# Patient Record
Sex: Male | Born: 1945 | Race: White | Hispanic: No | Marital: Married | Smoking: Never smoker
Health system: Southern US, Community
[De-identification: ages and names within clinical notes are randomized; demographics above are authoritative.]

## PROBLEM LIST (undated history)

## (undated) DIAGNOSIS — F419 Anxiety disorder, unspecified: Secondary | ICD-10-CM

## (undated) DIAGNOSIS — H269 Unspecified cataract: Secondary | ICD-10-CM

## (undated) DIAGNOSIS — E669 Obesity, unspecified: Secondary | ICD-10-CM

## (undated) DIAGNOSIS — T7840XA Allergy, unspecified, initial encounter: Secondary | ICD-10-CM

## (undated) DIAGNOSIS — E559 Vitamin D deficiency, unspecified: Secondary | ICD-10-CM

## (undated) DIAGNOSIS — E119 Type 2 diabetes mellitus without complications: Secondary | ICD-10-CM

## (undated) DIAGNOSIS — N200 Calculus of kidney: Secondary | ICD-10-CM

## (undated) DIAGNOSIS — E785 Hyperlipidemia, unspecified: Secondary | ICD-10-CM

## (undated) DIAGNOSIS — K219 Gastro-esophageal reflux disease without esophagitis: Secondary | ICD-10-CM

## (undated) DIAGNOSIS — I10 Essential (primary) hypertension: Secondary | ICD-10-CM

## (undated) DIAGNOSIS — E1340 Other specified diabetes mellitus with diabetic neuropathy, unspecified: Secondary | ICD-10-CM

## (undated) DIAGNOSIS — M1990 Unspecified osteoarthritis, unspecified site: Secondary | ICD-10-CM

## (undated) HISTORY — PX: OTHER SURGICAL HISTORY: SHX169

## (undated) HISTORY — PX: ELBOW SURGERY: SHX618

## (undated) HISTORY — DX: Essential (primary) hypertension: I10

## (undated) HISTORY — DX: Type 2 diabetes mellitus without complications: E11.9

## (undated) HISTORY — DX: Gastro-esophageal reflux disease without esophagitis: K21.9

## (undated) HISTORY — DX: Unspecified osteoarthritis, unspecified site: M19.90

## (undated) HISTORY — DX: Allergy, unspecified, initial encounter: T78.40XA

## (undated) HISTORY — PX: CATARACT EXTRACTION PHACO AND INTRAOCULAR LENS PLACEMENT W/ CORTICOSTEROID: SHX7020

## (undated) HISTORY — DX: Obesity, unspecified: E66.9

## (undated) HISTORY — DX: Unspecified cataract: H26.9

## (undated) HISTORY — PX: BACK SURGERY: SHX140

## (undated) HISTORY — DX: Other specified diabetes mellitus with diabetic neuropathy, unspecified: E13.40

## (undated) HISTORY — DX: Hyperlipidemia, unspecified: E78.5

## (undated) HISTORY — DX: Anxiety disorder, unspecified: F41.9

## (undated) HISTORY — DX: Vitamin D deficiency, unspecified: E55.9

## (undated) HISTORY — DX: Calculus of kidney: N20.0

---

## 1998-01-18 ENCOUNTER — Ambulatory Visit (HOSPITAL_COMMUNITY): Admission: RE | Admit: 1998-01-18 | Discharge: 1998-01-18 | Payer: Self-pay | Admitting: *Deleted

## 2000-04-12 ENCOUNTER — Ambulatory Visit (HOSPITAL_COMMUNITY): Admission: RE | Admit: 2000-04-12 | Discharge: 2000-04-12 | Payer: Self-pay | Admitting: *Deleted

## 2000-11-17 ENCOUNTER — Ambulatory Visit (HOSPITAL_COMMUNITY): Admission: RE | Admit: 2000-11-17 | Discharge: 2000-11-17 | Payer: Self-pay | Admitting: Internal Medicine

## 2000-11-17 ENCOUNTER — Encounter: Payer: Self-pay | Admitting: Internal Medicine

## 2002-02-02 ENCOUNTER — Encounter: Payer: Self-pay | Admitting: Gastroenterology

## 2002-02-08 ENCOUNTER — Encounter: Payer: Self-pay | Admitting: Gastroenterology

## 2002-05-05 ENCOUNTER — Emergency Department (HOSPITAL_COMMUNITY): Admission: EM | Admit: 2002-05-05 | Discharge: 2002-05-05 | Payer: Self-pay | Admitting: Emergency Medicine

## 2002-05-05 ENCOUNTER — Encounter: Payer: Self-pay | Admitting: Emergency Medicine

## 2004-07-25 ENCOUNTER — Ambulatory Visit (HOSPITAL_COMMUNITY): Admission: RE | Admit: 2004-07-25 | Discharge: 2004-07-25 | Payer: Self-pay | Admitting: Internal Medicine

## 2004-07-30 ENCOUNTER — Ambulatory Visit (HOSPITAL_COMMUNITY): Admission: RE | Admit: 2004-07-30 | Discharge: 2004-07-30 | Payer: Self-pay | Admitting: Internal Medicine

## 2004-08-05 ENCOUNTER — Ambulatory Visit (HOSPITAL_COMMUNITY): Admission: RE | Admit: 2004-08-05 | Discharge: 2004-08-05 | Payer: Self-pay | Admitting: Internal Medicine

## 2004-11-04 ENCOUNTER — Ambulatory Visit (HOSPITAL_COMMUNITY): Admission: RE | Admit: 2004-11-04 | Discharge: 2004-11-04 | Payer: Self-pay | Admitting: Orthopaedic Surgery

## 2004-11-04 ENCOUNTER — Encounter (INDEPENDENT_AMBULATORY_CARE_PROVIDER_SITE_OTHER): Payer: Self-pay | Admitting: *Deleted

## 2004-11-04 ENCOUNTER — Ambulatory Visit (HOSPITAL_BASED_OUTPATIENT_CLINIC_OR_DEPARTMENT_OTHER): Admission: RE | Admit: 2004-11-04 | Discharge: 2004-11-04 | Payer: Self-pay | Admitting: Orthopaedic Surgery

## 2005-04-16 ENCOUNTER — Ambulatory Visit: Payer: Self-pay

## 2005-08-04 ENCOUNTER — Ambulatory Visit (HOSPITAL_COMMUNITY): Admission: RE | Admit: 2005-08-04 | Discharge: 2005-08-04 | Payer: Self-pay | Admitting: Internal Medicine

## 2005-08-17 ENCOUNTER — Encounter: Admission: RE | Admit: 2005-08-17 | Discharge: 2005-11-15 | Payer: Self-pay | Admitting: Internal Medicine

## 2005-09-16 ENCOUNTER — Ambulatory Visit (HOSPITAL_COMMUNITY): Admission: RE | Admit: 2005-09-16 | Discharge: 2005-09-16 | Payer: Self-pay | Admitting: Internal Medicine

## 2009-06-04 ENCOUNTER — Ambulatory Visit (HOSPITAL_BASED_OUTPATIENT_CLINIC_OR_DEPARTMENT_OTHER): Admission: RE | Admit: 2009-06-04 | Discharge: 2009-06-04 | Payer: Self-pay | Admitting: Orthopaedic Surgery

## 2010-01-14 ENCOUNTER — Encounter: Payer: Self-pay | Admitting: Gastroenterology

## 2010-02-20 ENCOUNTER — Ambulatory Visit: Payer: Self-pay | Admitting: Gastroenterology

## 2010-02-20 DIAGNOSIS — K219 Gastro-esophageal reflux disease without esophagitis: Secondary | ICD-10-CM

## 2010-08-14 NOTE — Procedures (Signed)
Summary: EGD   EGD  Procedure date:  02/08/2002  Findings:      Findings: Stricture: 530.3 530.2:Barretts Location: [facility]    EGD  Procedure date:  02/08/2002  Findings:      Findings: Stricture: 530.3 530.2:Barretts Location: [facility]   Patient Name: Timothy Boyer, Timothy Boyer MRN:  Procedure Procedures: Panendoscopy (EGD) CPT: 43235.    with biopsy(s)/brushing(s). CPT: D1846139.    with esophageal dilation. CPT: G9296129.  Personnel: Endoscopist: Barbette Hair. Arlyce Dice, MD.  Referred By: Marisue Brooklyn, D.O.  Indications Symptoms: Dysphagia.  History  Pre-Exam Physical: Performed Feb 08, 2002  Entire physical exam was normal.  Exam Exam Info: Maximum depth of insertion Duodenum, intended Duodenum. Vocal cords visualized. Gastric retroflexion performed. ASA Classification: II. Tolerance: fair, adequate exam.  Sedation Meds: Robinul 0.2 given IV. Fentanyl 50 mcg. given IV. Versed 5 mg. given IV. Cetacaine Spray 2 sprays given aerosolized.  Monitoring: BP and pulse monitoring done. Oximetry used. Supplemental O2 given at 2 Liters.  Findings STRICTURE / STENOSIS: Stricture in Distal Esophagus.  38 cm from mouth. ICD9: Esophageal Stricture: 530.3.  - Dilation: Distal Esophagus. Maloney dilator used, Diameter: 17 mm, No Resistance, No Heme present on extraction. Outcome: successful.  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 Proximal margin 37 cm from mouth,  distal margin 40 cm. Length of Barrett's 3 cm. No inflammation present. Biopsy/Barrett's taken. ICD9: Barrett's: 530.2.   Assessment Abnormal examination, see findings above.  Diagnoses: 530.2: Barrett's.  530.3: Esophageal Stricture.   Events  Unplanned Intervention: No unplanned interventions were required.  Unplanned Events: There were no complications. Plans Medication(s): Await pathology.  Scheduling: Office Visit, to [employer]. Arlyce Dice, MD, around Mar 22, 2002.    This report was  created from the original endoscopy report, which was reviewed and signed by the above listed endoscopist.    cc. Amy Stevenson,DO

## 2010-08-14 NOTE — Procedures (Signed)
Summary: colonoscopy   Colonoscopy  Procedure date:  02/02/2002  Findings:      Results: Polyp.    Comments:      Repeat colonoscopy in 5 years.  Patient Name: Timothy Boyer, Timothy Boyer MRN:  Procedure Procedures: Colonoscopy CPT: 16109.    with Hot Biopsy(s)CPT: Z451292.  Personnel: Endoscopist: Barbette Hair. Arlyce Dice, MD.  Referred By: Marisue Brooklyn, D.O.  Indications  Average Risk Screening Routine.  History  Pre-Exam Physical: Performed Feb 02, 2002. Entire physical exam was normal.  Exam Exam: Extent of exam reached: Cecum, extent intended: Cecum.  The cecum was identified by IC valve. Colon retroflexion performed. ASA Classification: II. Tolerance: good.  Monitoring: Pulse and BP monitoring, Oximetry used. Supplemental O2 given. at 2 Liters.  Colon Prep Used Golytely for colon prep. Prep results: good.  Sedation Meds: Fentanyl 100 mcg. given IV. Versed 10 mg. given IV.  Findings POLYP: Ascending Colon, Maximum size: 3 mm. Procedure:  hot biopsy, ICD9: Colon Polyps: 211.3.   Assessment Abnormal examination, see findings above.  Diagnoses: 211.3: Colon Polyps.   Events  Unplanned Interventions: No intervention was required.  Unplanned Events: There were no complications. Plans Patient Education: Patient given standard instructions for: Polyps.  Scheduling/Referral: Colonoscopy, to Barbette Hair. Arlyce Dice, MD, around Feb 03, 2007.    This report was created from the original endoscopy report, which was reviewed and signed by the above listed endoscopist.    cc. Amy Stevenson,DO

## 2010-08-14 NOTE — Assessment & Plan Note (Signed)
Summary: barretts esophagus//family hx of esophageal cancer--ch.   History of Present Illness Visit Type: consult  Primary GI MD: Melvia Heaps MD [facility] Primary Lynne Takemoto: Lovenia Kim, DO  Requesting Darah Simkin: Lovenia Kim, DO  Chief Complaint: IBS and hemorrhoids  History of Present Illness:   Mr. Timothy Boyer is a pleasant 65 year old white male referred at the request of Dr. Elisabeth Most for evaluation of reflux.  In 2003 he underwent endoscopy and dilatation of a distal esophageal stricture.  Biopsies were taken because of a suspicion of Barrett's esophagus but this was not demonstrated by pathology.  A non-adenomatous polyp was removed at screening colonoscopy.  He has no GI complaints including pyrosis, dysphagia or change in bowel habits.  Family history is pertinent for a  brother who has either laryngeal or esophageal cancer   GI Review of Systems      Denies abdominal pain, acid reflux, belching, bloating, chest pain, dysphagia with liquids, dysphagia with solids, heartburn, loss of appetite, nausea, vomiting, vomiting blood, weight loss, and  weight gain.      Reports hemorrhoids and  irritable bowel syndrome.     Denies anal fissure, black tarry stools, change in bowel habit, constipation, diarrhea, diverticulosis, fecal incontinence, heme positive stool, jaundice, light color stool, liver problems, rectal bleeding, and  rectal pain.    Current Medications (verified): 1)  Claritin 10 Mg Tabs (Loratadine) .Marland Kitchen.. 1 By Mouth Once Daily 2)  Proventil Hfa 108 (90 Base) Mcg/act Aers (Albuterol Sulfate) .... Use Daily 3)  Omeprazole 20 Mg Cpdr (Omeprazole) .... One Tablet By Mouth Once Daily 4)  Aspirin 81 Mg Tbec (Aspirin) .Marland Kitchen.. 1 By Mouth Once Daily 5)  Gabapentin 300 Mg Caps (Gabapentin) .... One Tablet By Mouth Two Times A Day 6)  Zetia 10 Mg Tabs (Ezetimibe) .... One Tablet By Mouth Once Daily 7)  Triamterene-Hctz 37.5-25 Mg Tabs (Triamterene-Hctz) .... One Tablet By Mouth Once  Daily 8)  Finasteride 5 Mg Tabs (Finasteride) .... One Tablet By Mouth Once Daily 9)  Amlodipine Besy-Benazepril Hcl 10-20 Mg Caps (Amlodipine Besy-Benazepril Hcl) .... One Tablet By Mouth Once Daily 10)  Metformin Hcl 500 Mg Tabs (Metformin Hcl) .... Two Tablets By Mouth in The Morning and Two Tablets By Mouth At Night 11)  Dialyvite Vitamin D 5000 5000 Unit Caps (Cholecalciferol) .... One Capsule By Mouth Two Times A Day 12)  Citalopram Hydrobromide 40 Mg Tabs (Citalopram Hydrobromide) .... One Tablet By Mouth Once Daily 13)  Hydrocodone-Acetaminophen 7.5-750 Mg Tabs (Hydrocodone-Acetaminophen) .... As Needed 14)  Advair Diskus 100-50 Mcg/dose Aepb (Fluticasone-Salmeterol) .... As Directed 15)  Sm Folic Acid 400 Mcg Tabs (Folic Acid) .... One Tablet By Mouth Once Daily  Allergies (verified): 1)  ! Penicillin  Past History:  Past Medical History: Asthma Barretts Esophagus Diabetes GERD Hyperlipidemia Hypertension Kidney Stones Depression Fibromyalgia Irritable Bowel Syndrome  Past Surgical History: Vasectomy Bilateral Hand/ Finger Surgery Bilateral Wrist Surgery Left Elbow Surgery   Family History: No FH of Colon Cancer: Family History of Prostate Cancer:MGF Family History of Diabetes: 2 Sisters  Social History: Occupation: Curator Married Childern Patient has never smoked.  Alcohol Use - no Illicit Drug Use - no Smoking Status:  never Drug Use:  no  Review of Systems       The patient complains of allergy/sinus, back pain, change in vision, depression-new, itching, muscle pains/cramps, skin rash, swelling of feet/legs, and urine leakage.  The patient denies anemia, anxiety-new, arthritis/joint pain, blood in urine, breast changes/lumps, confusion, cough, coughing up  blood, fainting, fatigue, fever, headaches-new, hearing problems, heart murmur, heart rhythm changes, night sweats, nosebleeds, shortness of breath, sleeping problems, sore throat, swollen lymph  glands, thirst - excessive, urination - excessive, urination changes/pain, vision changes, and voice change.         All other systems were reviewed and were negative   Vital Signs:  Patient profile:   65 year old male Height:      67 inches Weight:      224 pounds BMI:     35.21 BSA:     2.12 Pulse rate:   96 / minute Pulse rhythm:   regular BP sitting:   142 / 86  (right arm) Cuff size:   regular  Vitals Entered By: Ok Anis CMA (February 20, 2010 3:26 PM)  Physical Exam  Additional Exam:  On physical exam he is a well-developed well-nourished male  skin: anicteric HEENT: normocephalic; PEERLA; no nasal or pharyngeal abnormalities neck: supple nodes: no cervical lymphadenopathy chest: clear to ausculatation and percussion heart: no murmurs, gallops, or rubs abd: soft, nontender; BS normoactive; no abdominal masses, tenderness, organomegaly rectal: deferred ext: no cynanosis, clubbing, edema skeletal: no deformities neuro: oriented x 3; no focal abnormalities    Impression & Recommendations:  Problem # 1:  GERD (ICD-530.81) Symptoms are well controlled with omeprazole.  There is no evidence for Barrett's esophagus.  Recommendations #1 continue omeprazole #2 I see no reason to repeat his endoscopy since she is at no higher  risk for developing carcinoma of the esophagus  Problem # 2:  SPECIAL SCREENING FOR MALIGNANT NEOPLASMS COLON (ICD-V76.51) Plan followup colonoscopy in 2013  Patient Instructions: 1)  Please continue current medications.  2)  Please schedule a follow-up appointment as needed.  3)  Copy sent to : Marisue Brooklyn, DO 4)  The medication list was reviewed and reconciled.  All changed / newly prescribed medications were explained.  A complete medication list was provided to the patient / caregiver.

## 2010-10-15 LAB — BASIC METABOLIC PANEL
BUN: 19 mg/dL (ref 6–23)
Creatinine, Ser: 0.88 mg/dL (ref 0.4–1.5)
GFR calc non Af Amer: >60 mL/min (ref >60)
Potassium: 3.8 mEq/L (ref 3.5–5.1)

## 2010-10-15 LAB — GLUCOSE, CAPILLARY: Glucose-Capillary: 108 mg/dL — ABNORMAL HIGH (ref 70–99)

## 2010-11-28 NOTE — Op Note (Signed)
Timothy Boyer, Timothy Boyer                ACCOUNT NO.:  [id number]   MEDICAL RECORD NO.:  [id number]          PATIENT TYPE:  AMB   LOCATION:  [facility]                          FACILITY:  [facility]   PHYSICIAN:  Lubertha Basque. Dalldorf, M.D.DATE OF BIRTH:  05/27/1946   DATE OF PROCEDURE:  11/04/2004  DATE OF DISCHARGE:                                 OPERATIVE REPORT   PREOPERATIVE DIAGNOSES:  1.  Right long trigger finger.  2.  Left thumb trigger thumb.  3.  Right ring cyst.  4.  Right foot mass.  5.  Five left heel plantar fasciitis.   POSTOPERATIVE DIAGNOSES:  1.  Right long trigger finger.  2.  Left thumb trigger thumb.  3.  Right ring cyst.  4.  Right foot mass.  5.  Five left heel plantar fasciitis.   PROCEDURES:  1.  Right long trigger finger.  2.  Left trigger thumb release.  3.  Right ring finger cyst excision.  4.  Right foot mass excision.  5.  Left heel injection.   ANESTHESIA:  General.   ATTENDING:  Lubertha Basque. Jerl Santos, M.D.   ASSISTANT:  Lindwood Qua, P.A.   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  He has had multiple injections  about his trigger fingers but persists with difficulty.  He is now offered  release primarily for his trigger finger issues but also has the cyst and  mass and fasciitis which he would like addressed of the same time.  Informed  operative consent was obtained after discussion of possible complications  of, reaction to anesthesia, infection, neurovascular injury, and recurrence  at all sites.   DESCRIPTION OF PROCEDURE:  The patient was taken to the operating suite,  where a general anesthetic was applied without difficulty.  He was then positioned supine and prepped and draped in normal sterile  fashion with three extremities appropriately draped.  After administration  of preop IV antibiotic, the left hand was addressed first.  His arm was  elevated, exsanguinated,  and a tourniquet inflated about the wrist.  A small  incision was made at the MCP flexion crease of the thumb with dissection  down to the A1 pulley, which was released.  This was very thickened.  I then  passively moved his thumb and no triggering was noted after release of the  pulley.  The wound was irrigated, followed by release of the tourniquet.  The fingers and thumb became pink and warm immediately.  The skin was  reapproximated with nylon, followed by injection with some Marcaine.  We  then addressed the right arm.  This arm was elevated, exsanguinated, and a  tourniquet inflated about the forearm.  An incision at the base of the long  finger just below the distal palmar flexion crease was made.  Dissection was  carried down to a very thickened A1 pulley, which was released.  A second  incision was made longitudinally at the base of the ring finger on its  radial border.  Dissection was carried down  with care taken to retract  neurovascular structures out of harm's way.  A fairly firm cyst measuring  about 5 x 3 x 2 mm was excised.  This was relatively adherent to the flexor  tendon sheath.  This was sent to pathology.  The wound was irrigated.  The  tourniquet was deflated, and a small amount bleeding was easily controlled  with Bovie cautery, but all fingers became pink and warm immediately.  The  two wounds were reapproximated with nylon, followed by injection with some  Marcaine.  We then addressed the right foot.  This was elevated,  exsanguinated, and a tourniquet inflated about his calf.  A small lateral  incision was made near the fifth metatarsal head.  Dissection was carried in the subcutaneous direction to the cyst, which was  located subcutaneously just proximal to the fifth metatarsal head on the  plantar aspect of the foot.  This was excised with a rongeur and seemed to  be fibrous tissue.  The tourniquet was deflated.  A small amount bleeding  was easily controlled  with Bovie cautery and with compression.  His toes all  became pink and warm immediately.  This wound was irrigated followed by closure with nylon and placement of  some Marcaine around the incision.  All of the aforementioned sites were  dressed with Adaptic and dry gauze dressing with some loose Ace wraps.  We  then performed a sterile prep on the lateral aspect of the left heel and  injected this with Depo-Medrol and Marcaine.  A total of 80 mg of Depo were  placed.  A Band-Aid was applied here.   Estimated blood loss and fluids can be obtained from anesthesia records, as  can all accurate tourniquet times.   DISPOSITION:  The patient was extubated in the operating room and taken to  the recovery room in stable condition.  Plans were for him to go home the same day and follow up in the office in  less than a week.  I will contact by phone tonight.  He had been mentioned  for this position. Estimated blood loss and fluids and pain for anesthesia  records as can all accurate tourniquet time.      PGD/MEDQ  D:  11/04/2004  T:  11/04/2004  Job:  [id number]

## 2011-01-13 ENCOUNTER — Encounter (HOSPITAL_BASED_OUTPATIENT_CLINIC_OR_DEPARTMENT_OTHER)
Admission: RE | Admit: 2011-01-13 | Discharge: 2011-01-13 | Disposition: A | Discharge status: Home / Self Care | Payer: BC Managed Care – PPO | Source: Ambulatory Visit | Attending: Orthopedic Surgery | Admitting: Orthopedic Surgery

## 2011-01-13 LAB — BASIC METABOLIC PANEL
BUN: 20 mg/dL (ref 6–23)
Calcium: 9.3 mg/dL (ref 8.4–10.5)
GFR calc non Af Amer: >60 mL/min (ref >60)
Glucose, Bld: 145 mg/dL — ABNORMAL HIGH (ref 70–99)

## 2011-01-15 ENCOUNTER — Other Ambulatory Visit: Payer: Self-pay | Admitting: Orthopedic Surgery

## 2011-01-15 ENCOUNTER — Ambulatory Visit (HOSPITAL_BASED_OUTPATIENT_CLINIC_OR_DEPARTMENT_OTHER)
Admission: RE | Admit: 2011-01-15 | Discharge: 2011-01-15 | Disposition: A | Discharge status: Home / Self Care | Payer: BC Managed Care – PPO | Source: Ambulatory Visit | Attending: Orthopedic Surgery | Admitting: Orthopedic Surgery

## 2011-01-15 DIAGNOSIS — J45909 Unspecified asthma, uncomplicated: Secondary | ICD-10-CM | POA: Insufficient documentation

## 2011-01-15 DIAGNOSIS — Z01812 Encounter for preprocedural laboratory examination: Secondary | ICD-10-CM | POA: Insufficient documentation

## 2011-01-15 DIAGNOSIS — E119 Type 2 diabetes mellitus without complications: Secondary | ICD-10-CM | POA: Insufficient documentation

## 2011-01-15 DIAGNOSIS — G609 Hereditary and idiopathic neuropathy, unspecified: Secondary | ICD-10-CM | POA: Insufficient documentation

## 2011-01-15 DIAGNOSIS — M65839 Other synovitis and tenosynovitis, unspecified forearm: Secondary | ICD-10-CM | POA: Insufficient documentation

## 2011-01-15 DIAGNOSIS — K219 Gastro-esophageal reflux disease without esophagitis: Secondary | ICD-10-CM | POA: Insufficient documentation

## 2011-01-15 DIAGNOSIS — Z01810 Encounter for preprocedural cardiovascular examination: Secondary | ICD-10-CM | POA: Insufficient documentation

## 2011-01-15 DIAGNOSIS — M65849 Other synovitis and tenosynovitis, unspecified hand: Secondary | ICD-10-CM | POA: Insufficient documentation

## 2011-01-15 DIAGNOSIS — M24549 Contracture, unspecified hand: Secondary | ICD-10-CM | POA: Insufficient documentation

## 2011-01-15 DIAGNOSIS — M720 Palmar fascial fibromatosis [Dupuytren]: Secondary | ICD-10-CM | POA: Insufficient documentation

## 2011-01-16 NOTE — Op Note (Signed)
NAMEZEBULIN, SIEGEL NO.:  [id number]  MEDICAL RECORD NO.:  [id number]  LOCATION:                                 FACILITY:  PHYSICIAN:  Katy Fitch. Arlette Schaad, M.D.      DATE OF BIRTH:  DATE OF PROCEDURE:  01/15/2011 DATE OF DISCHARGE:                              OPERATIVE REPORT   PREOPERATIVE DIAGNOSES: 1. Dupuytren's palmar fibromatosis, right palm and pretendinous fibers     to ring finger and long finger. 2. 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 3. 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  POSTOPERATIVE DIAGNOSIS:  Complex tendinopathy involving the flexor digitorum superficialis tendon of right long finger with large nodular tendinopathy mass that was resected for biopsy and crystal examination with a specimen of tendon being placed in absolute alcohol.  OPERATIONS: 1. Resection of Dupuytren's palmar fibromatosis from right ring finger     pretendinous fibers. 2. Resection of flexor digitorum superficialis from right long finger     with tenosynovectomy and tendon biopsy for routine H and E     evaluation and specimen of flexor digitorum superficialis tendon     sent in absolute alcohol for crystal examination. 3. Release of right ring finger A1 pulley.  OPERATING SURGEON:  Katy Fitch. Artasia Thang, MD  ASSISTANT:  Marveen Reeks Dasnoit, PA-C  ANESTHESIA:  General by LMA.  SUPERVISING ANESTHESIOLOGIST:  Janetta Hora. 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 Marcene Corning for evaluation and management of progressive flexion contracture of his right long finger following a prior trigger finger release and Dupuytren's palmar fibromatosis.  Mr. Motton  was becoming disabled due to a 90-degree flexion contracture of his right long finger PIP joint, mild flexion contracture of the DIP joint, and Dupuytren's palmar fibromatosis.  He is unable to place his hand into tight places while working on vehicles.  Preoperatively, we had a detailed informed consent.  He had a complex presentation with elements of Dupuytren's palmar fibromatosis causing flexion contracture of his ring finger and possibly the long finger, nodular tenosynovitis and/or tendinopathy of his long finger, and failure to respond to a prior long finger trigger release.  We advised him that we would proceed with exploration of the long and ring fingers anticipating fasciectomy and release of the right ring finger A1 pulley and we would address whatever pathology we identified in the long finger.  The spectrum of choices preoperatively included extensive nodular tenosynovitis versus tendinopathy leading to triggering beneath the A3 and/or A2 pulleys of long finger.  We discussed possible resection of superficialis slip versus subtotaled tendon resection to relieve nodular tenosynovitis.  Questions were invited and answered in detail.  PROCEDURE:  Bary Limbach was brought to room #2 at the [facility] and placed in supine position upon the operating table.  Following an anesthesia consult with Dr. Gelene Mink, general anesthesia by LMA technique  was recommended and accepted by Mr. Springer with a preoperative plexus block for postoperative comfort.  Under Dr. Thornton Dales strict supervision in room #2, general anesthesia by LMA technique was induced followed by routine Betadine scrub and paint of the right upper extremity.  Following routine surgical time-out and administration of 1 g of Ancef as an IV prophylactic antibiotic, the right arm was exsanguinated with Esmarch bandage and arterial tourniquet was inflated to 220 mmHg.  Procedure commenced with planning  Brunner zigzag incisions to allow exposure of the palmar fascia A1 pulley of both fingers and the flexor sheath between A3 and A1 of the long finger.  Skin incision was taken sharply followed by meticulous identification of the pretendinous fibers of the palmar fascia.  A formal Dupuytren's resection of the ring finger pretendinous fibers and fascia extending past the MP joint was completed followed by identification of the A1 pulley.  The A1 pulley was split with scalpel and scissors.  The tendon was delivered and cleared of pathologic tenosynovium.  The ring finger thereafter had full extension of the MP, PIP, and DIP joints and normal tendon glide.  Attention was then directed to the long finger where the extended Brunner incision was completed from the PIP flexion crease proximally to the midpalmar crease.  Extensive scarring was identified followed by release of multiple cords of Dupuytren's palmar fibromatosis.  The flexor sheath was very inflammatory.  There were areas of inflammatory tissue that were resected and passed off for pathologic evaluation.  The superficialis tendon was extremely swollen locking beneath the A2 pulley and causing compression of the profundus limiting DIP flexion and causing a DIP flexion posture.  We elected to proceed with resection of the ulnar slip of superficialis. This was complex as there was extensive scarring within the flexor sheath.  There was a chronic superficialis tendinopathy with firm nodules that could be either crystal or necrotic tendon.  A window was created between C1 and A3 distally and the ulnar slip of the superficialis insertion was released from the middle phalanx.  The decussation was identified by traction and released.  At the proximal wound with the PIP and DIP joints in flexion, we retracted the tendons and removed the ulnar slip of the superficialis segmentally.  The superficialis was then inspected and found to have  areas of nodular tendinopathy.  These were resected with scissors and placed in absolute alcohol for pathologic evaluation.  After completion of the debridement of superficialis tendon, we had normal glide of the profundus, full extension of the finger at the MP, PIP, and DIP joints, and full passive flexion.  After hemostasis was achieved, residual synovectomy was accomplished followed by repair of the skin wounds with corner sutures of 5-0 nylon and vertical mattress sutures of 5-0 nylon.  There were no apparent complications.  For aftercare, Mr. Yan is provided prescriptions for Percocet 5 mg 1 p.o. q.4-6 h. p.r.n. pain, 30 tablets without refill and also doxycycline 100 mg 1 p.o. b.i.d. x4 days as prophylactic antibiotic.     Katy Fitch Briggitte Boline, M.D.     RVS/MEDQ  D:  01/15/2011  T:  01/16/2011  Job:  [id number]  cc:   Lubertha Basque. Jerl Santos, M.D. Lovenia Kim, D.O.  Electronically Signed by Josephine Igo M.D. on 01/16/2011 09:46:18 AM

## 2012-01-26 ENCOUNTER — Encounter: Payer: Self-pay | Admitting: Gastroenterology

## 2012-08-19 ENCOUNTER — Other Ambulatory Visit: Payer: Self-pay | Admitting: Physician Assistant

## 2012-08-19 ENCOUNTER — Other Ambulatory Visit: Payer: Self-pay | Admitting: Internal Medicine

## 2012-08-19 DIAGNOSIS — I10 Essential (primary) hypertension: Secondary | ICD-10-CM

## 2012-08-23 ENCOUNTER — Encounter: Payer: Self-pay | Admitting: *Deleted

## 2012-08-25 ENCOUNTER — Other Ambulatory Visit: Payer: BC Managed Care – PPO

## 2012-08-30 ENCOUNTER — Ambulatory Visit (INDEPENDENT_AMBULATORY_CARE_PROVIDER_SITE_OTHER): Payer: Medicare Other | Admitting: Cardiology

## 2012-08-30 ENCOUNTER — Encounter: Payer: Self-pay | Admitting: Cardiology

## 2012-08-30 ENCOUNTER — Encounter: Payer: Self-pay | Admitting: *Deleted

## 2012-08-30 VITALS — BP 100/60 | HR 57 | Ht 68.0 in | Wt 215.0 lb

## 2012-08-30 DIAGNOSIS — I10 Essential (primary) hypertension: Secondary | ICD-10-CM

## 2012-08-30 DIAGNOSIS — R0609 Other forms of dyspnea: Secondary | ICD-10-CM

## 2012-08-30 DIAGNOSIS — R0600 Dyspnea, unspecified: Secondary | ICD-10-CM

## 2012-08-30 DIAGNOSIS — R0989 Other specified symptoms and signs involving the circulatory and respiratory systems: Secondary | ICD-10-CM

## 2012-08-30 DIAGNOSIS — E785 Hyperlipidemia, unspecified: Secondary | ICD-10-CM | POA: Insufficient documentation

## 2012-08-30 DIAGNOSIS — E669 Obesity, unspecified: Secondary | ICD-10-CM

## 2012-08-30 LAB — BASIC METABOLIC PANEL
BUN: 31 mg/dL — ABNORMAL HIGH (ref 6–23)
Chloride: 104 mEq/L (ref 96–112)
Creatinine, Ser: 1.1 mg/dL (ref 0.4–1.5)
GFR: 68.37 mL/min (ref >60.00)
Glucose, Bld: 105 mg/dL — ABNORMAL HIGH (ref 70–99)
Sodium: 142 mEq/L (ref 135–145)

## 2012-08-30 LAB — TSH: TSH: 1.5 u[IU]/mL (ref 0.35–5.50)

## 2012-08-30 NOTE — Patient Instructions (Addendum)
Your physician recommends that you schedule a follow-up appointment in: 8 WEEKS WITH DR Jens Som  Your physician has requested that you have a stress echocardiogram. For further information please visit [email or web address] Please follow instruction sheet as given.   Your physician recommends that you HAVE LAB WORK TODAY

## 2012-08-30 NOTE — Progress Notes (Signed)
HPI: 67 year old male for evaluation of hypertension. Patient apparently had a negative stress test in 2006. Patient has had hypertension since he was 68 years old. However in the past month his blood pressure has spiked with systolic as high as 200. His minoxidil was recently increased. Since then his blood pressure has averaged approximately 130 to 135/90-95 by his report. Patient has noted some dyspnea on exertion recently. No orthopnea, PND, pedal edema, syncope or exertional chest pain. The cause of his hypertension we were asked to evaluate.  Current Outpatient Prescriptions  Medication Sig Dispense Refill  . ALBUTEROL IN Inhale into the lungs as needed.      Marland Kitchen aspirin 81 MG tablet Take 81 mg by mouth daily.      . benazepril-hydrochlorthiazide (LOTENSIN HCT) 20-12.5 MG per tablet 2 tabs po qd      . BISOPROLOL FUMARATE PO 1 tab po qd 10/12.5mg       . citalopram (CELEXA) 40 MG tablet Take 40 mg by mouth daily.      . cloNIDine (CATAPRES) 0.2 MG tablet Take 0.2 mg by mouth.      . finasteride (PROSCAR) 5 MG tablet 1/2 tab po qd      . furosemide (LASIX) 40 MG tablet Take 40 mg by mouth daily.      Marland Kitchen gabapentin (NEURONTIN) 300 MG capsule 2 am 1 pm      . metFORMIN (GLUCOPHAGE) 1000 MG tablet Take 1,000 mg by mouth 2 (two) times daily with a meal.      . minoxidil (LONITEN) 10 MG tablet Take 10 mg by mouth daily.      . Naproxen Sodium (ALEVE PO) Take by mouth as needed.      Marland Kitchen omeprazole (PRILOSEC) 20 MG capsule Take 20 mg by mouth daily.      Marland Kitchen VITAMIN D, CHOLECALCIFEROL, PO Take 1 tablet by mouth daily.       No current facility-administered medications for this visit.    Allergies  Allergen Reactions  . Penicillins     Past Medical History  Diagnosis Date  . DM   . GERD   . HTN (hypertension)   . Obesity   . Hyperlipidemia   . Nephrolithiasis     Past Surgical History  Procedure Laterality Date  . Back surgery    . Carpel tunnal    . Elbow surgery      History    Social History  . Marital Status: Married    Spouse Name: N/A    Number of Children: 1  . 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 Smoking status: Never Smoker   . Smokeless tobacco: Not on file  . Alcohol Use: Yes     Comment: Rare  . Drug Use: No  . Sexually Active: Not on file   Other Topics Concern  . Not on file   Social History Narrative  . No narrative on file    Family History  Problem Relation Age of Onset  . Heart disease Brother     Valve surgery  . Heart disease Brother     ROS: no fevers or chills, productive cough, hemoptysis, dysphasia, odynophagia, melena, hematochezia, dysuria, hematuria, rash, seizure activity, orthopnea, PND, pedal edema, claudication. Remaining systems are negative.  Physical Exam:   Blood pressure 100/60, pulse 57, height 5\' 8"  (1.727 m), weight 215 lb (97.523 kg).  General:  Well developed/well  nourished in NAD Skin warm/dry Patient not depressed No peripheral clubbing Back-normal HEENT-normal/normal eyelids Neck supple/normal carotid upstroke bilaterally; no bruits; no JVD; no thyromegaly chest - CTA/ normal expansion CV - RRR/normal S1 and S2; no murmurs, rubs or gallops;  PMI nondisplaced Abdomen -NT/ND, no HSM, no mass, + bowel sounds, no bruit 2+ femoral pulses, no bruits Ext-no edema, chords, 2+ DP Neuro-grossly nonfocal  ECG 08/17/2012-sinus rhythm, cannot rule out prior septal infarct and nonspecific ST changes.  ECG shows sinus bradycardia at a rate of 57 and no ST changes.

## 2012-08-30 NOTE — Assessment & Plan Note (Signed)
Given diabetes mellitus will schedule stress echocardiogram to screen for coronary disease and to quantitate LV function.

## 2012-08-30 NOTE — Assessment & Plan Note (Signed)
Continue statin. 

## 2012-08-30 NOTE — Assessment & Plan Note (Signed)
Patient's blood pressure spiked recently by his report. He is scheduled for renal Dopplers tomorrow and we will await those results. Other causes of secondary hypertension are less likely. Check potassium, renal function and TSH. Check cortisol level.Timothy Boyer His blood pressure is controlled today. He will continue to track his blood pressure at home and keep records. He will bring his cuff for the next office visit. We will adjust his medical regimen based on followup readings.

## 2012-08-31 ENCOUNTER — Ambulatory Visit
Admission: RE | Admit: 2012-08-31 | Discharge: 2012-08-31 | Disposition: A | Discharge status: Home / Self Care | Payer: Medicare Other | Source: Ambulatory Visit | Attending: Physician Assistant | Admitting: Physician Assistant

## 2012-08-31 ENCOUNTER — Other Ambulatory Visit: Payer: Self-pay | Admitting: *Deleted

## 2012-08-31 DIAGNOSIS — I10 Essential (primary) hypertension: Secondary | ICD-10-CM

## 2012-09-05 ENCOUNTER — Encounter: Payer: Self-pay | Admitting: Cardiology

## 2012-09-05 ENCOUNTER — Telehealth: Payer: Self-pay | Admitting: Cardiology

## 2012-09-05 NOTE — Telephone Encounter (Signed)
Pt's wife calling to see if we received results  re ultrasound showing renal stenosis at [facility] imaging

## 2012-09-05 NOTE — Telephone Encounter (Signed)
Pt's wife called to see if MD has received the renal stenosis ultra sound if so does the U/S results affects the plans of having the stress echo done on 09/09/12. Pt and wife aware that we have the renal US results from [facility] in pt's records.

## 2012-09-06 NOTE — Telephone Encounter (Signed)
Left message for pt wife, pt will continue to have stress testing for dyspnea.

## 2012-09-09 ENCOUNTER — Ambulatory Visit (HOSPITAL_COMMUNITY): Payer: Medicare Other | Attending: Internal Medicine

## 2012-09-09 ENCOUNTER — Other Ambulatory Visit (INDEPENDENT_AMBULATORY_CARE_PROVIDER_SITE_OTHER): Payer: Medicare Other

## 2012-09-09 ENCOUNTER — Encounter: Payer: Self-pay | Admitting: Cardiology

## 2012-09-09 DIAGNOSIS — R0609 Other forms of dyspnea: Secondary | ICD-10-CM | POA: Insufficient documentation

## 2012-09-09 DIAGNOSIS — R0989 Other specified symptoms and signs involving the circulatory and respiratory systems: Secondary | ICD-10-CM | POA: Insufficient documentation

## 2012-09-09 DIAGNOSIS — R7989 Other specified abnormal findings of blood chemistry: Secondary | ICD-10-CM

## 2012-09-09 LAB — BASIC METABOLIC PANEL
Chloride: 105 mEq/L (ref 96–112)
Creatinine, Ser: 1.1 mg/dL (ref 0.4–1.5)
Potassium: 3.7 mEq/L (ref 3.5–5.1)

## 2012-09-09 NOTE — Progress Notes (Signed)
Echocardiogram performed.  

## 2012-09-12 ENCOUNTER — Telehealth: Payer: Self-pay | Admitting: Cardiology

## 2012-09-12 NOTE — Telephone Encounter (Signed)
New problem   Pt's wife called and stated Dr Tenny Craw called pt on Friday and told pt to take it easy during the weekend and was told someone from our office would call him today she don't know what for and was very concerned about this. Pt's wife was wondering if someone was going to call today and who. She haven't heard from anyone yet.

## 2012-09-12 NOTE — Telephone Encounter (Signed)
Pt needs appt with Dr. Jens Som sooner rather than later to follow up on echo.

## 2012-09-12 NOTE — Telephone Encounter (Signed)
Pt given an appt for 4/4 at 8:15 with Dr. Jens Som @ 8:15. Pt wants sooner appt as he and his wife are concerned about his Echo results. Please call pt back.

## 2012-09-13 NOTE — Telephone Encounter (Signed)
Spoke with pt wife, appointment rescheduled.

## 2012-09-15 ENCOUNTER — Encounter: Payer: Self-pay | Admitting: Cardiology

## 2012-09-15 ENCOUNTER — Ambulatory Visit (INDEPENDENT_AMBULATORY_CARE_PROVIDER_SITE_OTHER)
Admission: RE | Admit: 2012-09-15 | Discharge: 2012-09-15 | Disposition: A | Discharge status: Home / Self Care | Payer: Medicare Other | Source: Ambulatory Visit | Attending: Cardiology | Admitting: Cardiology

## 2012-09-15 ENCOUNTER — Ambulatory Visit (INDEPENDENT_AMBULATORY_CARE_PROVIDER_SITE_OTHER): Payer: Medicare Other | Admitting: Cardiology

## 2012-09-15 ENCOUNTER — Encounter: Payer: Self-pay | Admitting: *Deleted

## 2012-09-15 VITALS — BP 110/50 | HR 70 | Ht 67.0 in | Wt 220.0 lb

## 2012-09-15 DIAGNOSIS — R9430 Abnormal result of cardiovascular function study, unspecified: Secondary | ICD-10-CM

## 2012-09-15 LAB — CBC WITH DIFFERENTIAL/PLATELET
Basophils Relative: 0.4 % (ref 0.0–3.0)
Eosinophils Relative: 1.1 % (ref 0.0–5.0)
HCT: 33.5 % — ABNORMAL LOW (ref 39.0–52.0)
MCV: 77.7 fl — ABNORMAL LOW (ref 78.0–100.0)
Monocytes Absolute: 0.5 10*3/uL (ref 0.1–1.0)
Neutrophils Relative %: 63.2 % (ref 43.0–77.0)
RBC: 4.31 Mil/uL (ref 4.22–5.81)
WBC: 5.8 10*3/uL (ref 4.5–10.5)

## 2012-09-15 LAB — BASIC METABOLIC PANEL
BUN: 18 mg/dL (ref 6–23)
CO2: 29 mEq/L (ref 19–32)
GFR: 86.46 mL/min (ref >60.00)
Glucose, Bld: 143 mg/dL — ABNORMAL HIGH (ref 70–99)
Potassium: 3.3 mEq/L — ABNORMAL LOW (ref 3.5–5.1)

## 2012-09-15 NOTE — Progress Notes (Signed)
 HPI: Pleasant male I initially saw in Feb 2014 for evaluation of hypertension. Stress echocardiogram in February 2014 showed EKG changes and hypokinesis of the mid and distal anterior wall suggestive of ischemia. Renal Dopplers in February of 2014 suggested left renal artery stenosis. Laboratories in February 2014 showed a normal sodium, normal potassium, normal TSH and normal cortisol level. Since I last saw him, he has some dyspnea on exertion with more extreme activities. No orthopnea, PND, pedal edema or chest pain or syncope. Some lightheadedness at times.   Current Outpatient Prescriptions  Medication Sig Dispense Refill  . ALBUTEROL IN Inhale into the lungs as needed.      . aspirin 81 MG tablet Take 81 mg by mouth daily.      . benazepril-hydrochlorthiazide (LOTENSIN HCT) 20-12.5 MG per tablet 2 tabs po qd      . bisoprolol (ZEBETA) 10 MG tablet Take 10 mg by mouth daily.      . citalopram (CELEXA) 40 MG tablet Take 40 mg by mouth daily.      . cloNIDine (CATAPRES) 0.2 MG tablet Take 0.2 mg by mouth.      . finasteride (PROSCAR) 5 MG tablet 1/2 tab po qd      . furosemide (LASIX) 40 MG tablet Take 40 mg by mouth daily.      . gabapentin (NEURONTIN) 300 MG capsule 2 am 1 pm      . metFORMIN (GLUCOPHAGE) 1000 MG tablet Take 1,000 mg by mouth 2 (two) times daily with a meal.      . minoxidil (LONITEN) 10 MG tablet Take 10 mg by mouth daily.      . Naproxen Sodium (ALEVE PO) Take by mouth as needed.      . omeprazole (PRILOSEC) 20 MG capsule Take 20 mg by mouth daily.      . Probiotic Product (PROBIOTIC DAILY PO) Take by mouth daily.      . VITAMIN D, CHOLECALCIFEROL, PO Take 1 tablet by mouth daily.       No current facility-administered medications for this visit.     Past Medical History  Diagnosis Date  . DM   . GERD   . HTN (hypertension)   . Obesity   . Hyperlipidemia   . Nephrolithiasis     Past Surgical History  Procedure Laterality Date  . Back surgery    .  Carpel tunnal    . Elbow surgery      History   Social History  . Marital Status: Married    Spouse Name: N/A    Number of Children: 1  . 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 Smoking status: Never Smoker   . Smokeless tobacco: Not on file  . Alcohol Use: Yes     Comment: Rare  . Drug Use: No  . Sexually Active: Not on file   Other Topics Concern  . Not on file   Social History Narrative  . No narrative on file    ROS: no fevers or chills, productive cough, hemoptysis, dysphasia, odynophagia, melena, hematochezia, dysuria, hematuria, rash, seizure activity, orthopnea, PND, pedal edema, claudication. Remaining systems are negative.  Physical Exam: Well-developed well-nourished in no acute distress.  Skin is warm and dry.  HEENT is normal.  Neck is supple.  Chest is clear to auscultation with normal expansion.  Cardiovascular exam is regular rate and rhythm.  Abdominal exam   nontender or distended. No masses palpated. Extremities show no edema. neuro grossly intact       

## 2012-09-15 NOTE — Assessment & Plan Note (Signed)
Patient's blood pressure is now mildly decreased. I will decrease his clonidine to 0.1 mg daily for 4 days and then discontinue. Hopefully we can consolidate his regimen. His renal Dopplers suggest possible renal artery stenosis. We will plan a renal arteriogram at the time of his cardiac catheterization.

## 2012-09-15 NOTE — Assessment & Plan Note (Addendum)
Patient has multiple risk factors including 40 years of hypertension and 10-15 years of diabetes mellitus. His stress echocardiogram suggests anterior ischemia. Plan to proceed with cardiac catheterization. The risks and benefits were discussed and the patient agrees to proceed. Hold Glucophage 48 hours following procedure. Hold Lasix a.m. Of procedure.

## 2012-09-15 NOTE — Patient Instructions (Addendum)
Your physician has requested that you have a cardiac catheterization. Cardiac catheterization is used to diagnose and/or treat various heart conditions. Doctors may recommend this procedure for a number of different reasons. The most common reason is to evaluate chest pain. Chest pain can be a symptom of coronary artery disease (CAD), and cardiac catheterization can show whether plaque is narrowing or blocking your heart's arteries. This procedure is also used to evaluate the valves, as well as measure the blood flow and oxygen levels in different parts of your heart. For further information please visit [email or web address] Please follow instruction sheet, as given.   Your physician recommends that you HAVE LAB WORK TODAY  A chest x-ray takes a picture of the organs and structures inside the chest, including the heart, lungs, and blood vessels. This test can show several things, including, whether the heart is enlarges; whether fluid is building up in the lungs; and whether pacemaker / defibrillator leads are still in place.AT [address]  DECREASE CLONIDINE TO 0.1 MG FOR FOUR DAYS AND THEN STOP

## 2012-09-19 ENCOUNTER — Other Ambulatory Visit: Payer: Self-pay | Admitting: *Deleted

## 2012-09-19 ENCOUNTER — Encounter (HOSPITAL_BASED_OUTPATIENT_CLINIC_OR_DEPARTMENT_OTHER)
Admission: RE | Disposition: A | Discharge status: Home / Self Care | Payer: Self-pay | Source: Home / Self Care | Attending: Cardiovascular Disease

## 2012-09-19 ENCOUNTER — Inpatient Hospital Stay (HOSPITAL_BASED_OUTPATIENT_CLINIC_OR_DEPARTMENT_OTHER)
Admission: RE | Admit: 2012-09-19 | Discharge: 2012-09-19 | Disposition: A | Discharge status: Home / Self Care | Payer: Medicare Other | Attending: Cardiovascular Disease | Admitting: Cardiovascular Disease

## 2012-09-19 DIAGNOSIS — I739 Peripheral vascular disease, unspecified: Secondary | ICD-10-CM

## 2012-09-19 DIAGNOSIS — I10 Essential (primary) hypertension: Secondary | ICD-10-CM | POA: Insufficient documentation

## 2012-09-19 DIAGNOSIS — R9439 Abnormal result of other cardiovascular function study: Secondary | ICD-10-CM | POA: Insufficient documentation

## 2012-09-19 DIAGNOSIS — I2510 Atherosclerotic heart disease of native coronary artery without angina pectoris: Secondary | ICD-10-CM | POA: Insufficient documentation

## 2012-09-19 DIAGNOSIS — E876 Hypokalemia: Secondary | ICD-10-CM

## 2012-09-19 SURGERY — JV LEFT HEART CATHETERIZATION WITH CORONARY ANGIOGRAM
Anesthesia: Moderate Sedation

## 2012-09-19 MED ORDER — SODIUM CHLORIDE 0.9 % IV SOLN
1.0000 mL/kg/h | INTRAVENOUS | Status: DC
  Administered 2012-09-19: 1 mL/kg/h via INTRAVENOUS

## 2012-09-19 MED ORDER — POTASSIUM CHLORIDE CRYS ER 20 MEQ PO TBCR: 20.0000 meq | EXTENDED_RELEASE_TABLET | Freq: Every day | ORAL | Status: DC

## 2012-09-19 MED ORDER — ONDANSETRON HCL 4 MG/2ML IJ SOLN: 4.0000 mg | Freq: Four times a day (QID) | INTRAMUSCULAR | Status: DC | PRN

## 2012-09-19 MED ORDER — SODIUM CHLORIDE 0.9 % IJ SOLN: 3.0000 mL | Freq: Two times a day (BID) | INTRAMUSCULAR | Status: DC

## 2012-09-19 MED ORDER — SODIUM CHLORIDE 0.9 % IV SOLN: 250.0000 mL | INTRAVENOUS | Status: DC | PRN

## 2012-09-19 MED ORDER — ASPIRIN 81 MG PO CHEW
324.0000 mg | CHEWABLE_TABLET | ORAL | Status: AC
  Administered 2012-09-19: 324 mg via ORAL

## 2012-09-19 MED ORDER — DIAZEPAM 2 MG PO TABS
2.0000 mg | ORAL_TABLET | ORAL | Status: AC
  Administered 2012-09-19: 2 mg via ORAL

## 2012-09-19 MED ORDER — ACETAMINOPHEN 325 MG PO TABS: 650.0000 mg | ORAL_TABLET | ORAL | Status: DC | PRN

## 2012-09-19 MED ORDER — SODIUM CHLORIDE 0.9 % IV SOLN: 1.0000 mL/kg/h | INTRAVENOUS | Status: DC

## 2012-09-19 MED ORDER — SODIUM CHLORIDE 0.9 % IJ SOLN: 3.0000 mL | INTRAMUSCULAR | Status: DC | PRN

## 2012-09-19 NOTE — Progress Notes (Signed)
Negative Allen's test to right hand pre-procedure.

## 2012-09-19 NOTE — Interval H&P Note (Signed)
History and Physical Interval Note:  09/19/2012 10:35 AM  Timothy Boyer  has presented today for surgery, with the diagnosis of Abnormal Stress  The various methods of treatment have been discussed with the patient and family. After consideration of risks, benefits and other options for treatment, the patient has consented to  Procedure(s): JV LEFT HEART CATHETERIZATION WITH CORONARY ANGIOGRAM (N/A) as a surgical intervention .  The patient's history has been reviewed, patient examined, no change in status, stable for surgery.  I have reviewed the patient's chart and labs.  Questions were answered to the patient's satisfaction.     Tonny Bollman

## 2012-09-19 NOTE — H&P (View-Only) (Signed)
HPI: Pleasant male I initially saw in Feb 2014 for evaluation of hypertension. Stress echocardiogram in February 2014 showed EKG changes and hypokinesis of the mid and distal anterior wall suggestive of ischemia. Renal Dopplers in February of 2014 suggested left renal artery stenosis. Laboratories in February 2014 showed a normal sodium, normal potassium, normal TSH and normal cortisol level. Since I last saw him, he has some dyspnea on exertion with more extreme activities. No orthopnea, PND, pedal edema or chest pain or syncope. Some lightheadedness at times.   Current Outpatient Prescriptions  Medication Sig Dispense Refill  . ALBUTEROL IN Inhale into the lungs as needed.      Marland Kitchen aspirin 81 MG tablet Take 81 mg by mouth daily.      . benazepril-hydrochlorthiazide (LOTENSIN HCT) 20-12.5 MG per tablet 2 tabs po qd      . bisoprolol (ZEBETA) 10 MG tablet Take 10 mg by mouth daily.      . citalopram (CELEXA) 40 MG tablet Take 40 mg by mouth daily.      . cloNIDine (CATAPRES) 0.2 MG tablet Take 0.2 mg by mouth.      . finasteride (PROSCAR) 5 MG tablet 1/2 tab po qd      . furosemide (LASIX) 40 MG tablet Take 40 mg by mouth daily.      Marland Kitchen gabapentin (NEURONTIN) 300 MG capsule 2 am 1 pm      . metFORMIN (GLUCOPHAGE) 1000 MG tablet Take 1,000 mg by mouth 2 (two) times daily with a meal.      . minoxidil (LONITEN) 10 MG tablet Take 10 mg by mouth daily.      . Naproxen Sodium (ALEVE PO) Take by mouth as needed.      Marland Kitchen omeprazole (PRILOSEC) 20 MG capsule Take 20 mg by mouth daily.      . Probiotic Product (PROBIOTIC DAILY PO) Take by mouth daily.      Marland Kitchen VITAMIN D, CHOLECALCIFEROL, PO Take 1 tablet by mouth daily.       No current facility-administered medications for this visit.     Past Medical History  Diagnosis Date  . DM   . GERD   . HTN (hypertension)   . Obesity   . Hyperlipidemia   . Nephrolithiasis     Past Surgical History  Procedure Laterality Date  . Back surgery    .  Carpel tunnal    . Elbow surgery      History   Social History  . Marital Status: Married    Spouse Name: N/A    Number of Children: 1  . 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 Smoking status: Never Smoker   . Smokeless tobacco: Not on file  . Alcohol Use: Yes     Comment: Rare  . Drug Use: No  . Sexually Active: Not on file   Other Topics Concern  . Not on file   Social History Narrative  . No narrative on file    ROS: no fevers or chills, productive cough, hemoptysis, dysphasia, odynophagia, melena, hematochezia, dysuria, hematuria, rash, seizure activity, orthopnea, PND, pedal edema, claudication. Remaining systems are negative.  Physical Exam: Well-developed well-nourished in no acute distress.  Skin is warm and dry.  HEENT is normal.  Neck is supple.  Chest is clear to auscultation with normal expansion.  Cardiovascular exam is regular rate and rhythm.  Abdominal exam  nontender or distended. No masses palpated. Extremities show no edema. neuro grossly intact

## 2012-09-19 NOTE — Progress Notes (Signed)
Bedrest begins @ 1130.  Tegaderm dressing applied to right groin site by Theodoro Grist, site level 0.

## 2012-09-19 NOTE — CV Procedure (Signed)
   Cardiac Catheterization Procedure Note  Name: Timothy Boyer MRN: [id number] DOB: [birth year]  Procedure: Left Heart Cath, Selective Coronary Angiography, LV angiography, abdominal aortic angiography, selective left renal angiography.  Indication: Abnormal stress echocardiogram with anterolateral hypokinesis. Malignant hypertension   Procedural details: The right groin was prepped, draped, and anesthetized with 1% lidocaine. Using modified Seldinger technique, a 4 French sheath was introduced into the right femoral artery. Standard Judkins catheters were used for coronary angiography and left ventriculography. Catheter exchanges were performed over a guidewire. The pigtail catheter was pulled back into the abdominal aorta and abdominal aortography was performed. There was a questionable stenosis in the left renal artery stenosis so selective left renal artery angiography was performed with the JR 4 catheter. There were no immediate procedural complications. The patient was transferred to the post catheterization recovery area for further monitoring.  Procedural Findings: Hemodynamics:  AO 142/73 with a mean of 100 LV 142/14   Coronary angiography: Coronary dominance: right  Left mainstem: There is a shelflike 20-30% distal left main plaque. Overall left main is widely patent.  Left anterior descending (LAD): There is mild calcification of the proximal LAD. There are minor irregularities but no significant stenoses are noted. The diagonal branches are patent. The mid LAD has 30-40% stenosis but there are no areas of high-grade disease noted.  Left circumflex (LCx): The left circumflex is a large, dominant vessel. The first OM has 40% stenosis. The PDA and PLA branches are patent. The AV groove circumflex has mild diffuse nonobstructive stenosis of no greater than 30-40%.  Right coronary artery (RCA): Small, nondominant vessel with no significant stenosis.  Left ventriculography: There is  mild hypokinesis of the distal anterolateral wall. The estimated left ventricular ejection fraction is 55%  Abdominal aortic angiography: The abdominal aorta is widely patent. The common iliac arteries are widely patent. The renal arteries are widely patent. There are no significant stenoses noted. The left renal artery was selectively injected and there is no stenosis throughout the single, left renal artery  Final Conclusions:   1. Diffuse nonobstructive coronary artery disease as outlined 2. Mild segmental contraction abnormality left ventricle with preserved overall left ventricular systolic function 3. Widely patent renal arteries bilaterally  Tonny Bollman 09/19/2012, 11:14 AM

## 2012-09-22 ENCOUNTER — Telehealth: Payer: Self-pay | Admitting: Cardiology

## 2012-09-22 NOTE — Telephone Encounter (Signed)
Spoke with pt wife, order will be faxed to [phone].

## 2012-09-22 NOTE — Telephone Encounter (Signed)
New problem     Need lab order fax over to Dr. Oneta Rack office

## 2012-09-26 ENCOUNTER — Other Ambulatory Visit: Payer: Medicare Other

## 2012-10-14 ENCOUNTER — Ambulatory Visit: Payer: Medicare Other | Admitting: Cardiology

## 2012-10-20 ENCOUNTER — Encounter: Payer: Self-pay | Admitting: Gastroenterology

## 2012-11-03 ENCOUNTER — Ambulatory Visit: Payer: Medicare Other | Admitting: Cardiology

## 2013-05-17 ENCOUNTER — Encounter: Payer: Self-pay | Admitting: Internal Medicine

## 2013-05-18 ENCOUNTER — Encounter: Payer: Self-pay | Admitting: Internal Medicine

## 2013-05-18 ENCOUNTER — Other Ambulatory Visit: Payer: Self-pay | Admitting: Internal Medicine

## 2013-05-18 ENCOUNTER — Ambulatory Visit: Payer: Commercial Managed Care - HMO | Admitting: Internal Medicine

## 2013-05-18 VITALS — BP 102/64 | HR 56 | Temp 97.9°F | Resp 18 | Ht 67.0 in | Wt 222.0 lb

## 2013-05-18 DIAGNOSIS — E782 Mixed hyperlipidemia: Secondary | ICD-10-CM

## 2013-05-18 DIAGNOSIS — I10 Essential (primary) hypertension: Secondary | ICD-10-CM

## 2013-05-18 DIAGNOSIS — E559 Vitamin D deficiency, unspecified: Secondary | ICD-10-CM

## 2013-05-18 DIAGNOSIS — E1059 Type 1 diabetes mellitus with other circulatory complications: Secondary | ICD-10-CM

## 2013-05-18 DIAGNOSIS — Z0000 Encounter for general adult medical examination without abnormal findings: Secondary | ICD-10-CM

## 2013-05-18 DIAGNOSIS — Z1212 Encounter for screening for malignant neoplasm of rectum: Secondary | ICD-10-CM

## 2013-05-18 DIAGNOSIS — Z79899 Other long term (current) drug therapy: Secondary | ICD-10-CM

## 2013-05-18 DIAGNOSIS — Z125 Encounter for screening for malignant neoplasm of prostate: Secondary | ICD-10-CM

## 2013-05-18 LAB — CBC WITH DIFFERENTIAL/PLATELET
Basophils Relative: 0 % (ref 0–1)
Hemoglobin: 11.2 g/dL — ABNORMAL LOW (ref 13.0–17.0)
MCHC: 32.6 g/dL (ref 30.0–36.0)
Monocytes Relative: 7 % (ref 3–12)
Neutro Abs: 3.9 10*3/uL (ref 1.7–7.7)
Neutrophils Relative %: 58 % (ref 43–77)
RBC: 4.32 MIL/uL (ref 4.22–5.81)

## 2013-05-18 MED ORDER — NAPROXEN 500 MG PO TABS: 500.0000 mg | ORAL_TABLET | ORAL | Status: DC | PRN

## 2013-05-18 MED ORDER — PRAVASTATIN SODIUM 40 MG PO TABS: 40.0000 mg | ORAL_TABLET | Freq: Every evening | ORAL | Status: DC

## 2013-05-18 MED ORDER — POTASSIUM CHLORIDE CRYS ER 20 MEQ PO TBCR: 20.0000 meq | EXTENDED_RELEASE_TABLET | Freq: Two times a day (BID) | ORAL | Status: DC

## 2013-05-18 MED ORDER — BISOPROLOL-HYDROCHLOROTHIAZIDE 10-6.25 MG PO TABS: 1.0000 | ORAL_TABLET | Freq: Every day | ORAL | Status: DC

## 2013-05-18 NOTE — Addendum Note (Signed)
Addended by: Lucky Cowboy on: 05/18/2013 07:09 PM   Modules accepted: Orders

## 2013-05-18 NOTE — Patient Instructions (Addendum)
Continue diet and meds as discussed and new rx's sent to [facility] [place]  Cholesterol Cholesterol is a white, waxy, fat-like protein needed by your body in small amounts. The liver makes all the cholesterol you need. It is carried from the liver by the blood through the blood vessels. Deposits (plaque) may build up on blood vessel walls. This makes the arteries narrower and stiffer. Plaque increases the risk for heart attack and stroke. You cannot feel your cholesterol level even if it is very high. The only way to know is by a blood test to check your lipid (fats) levels. Once you know your cholesterol levels, you should keep a record of the test results. Work with your caregiver to to keep your levels in the desired range. WHAT THE RESULTS MEAN:  Total cholesterol is a rough measure of all the cholesterol in your blood.  LDL is the so-called bad cholesterol. This is the type that deposits cholesterol in the walls of the arteries. You want this level to be low.  HDL is the good cholesterol because it cleans the arteries and carries the LDL away. You want this level to be high.  Triglycerides are fat that the body can either burn for energy or store. High levels are closely linked to heart disease. DESIRED LEVELS:  Total cholesterol below 200.  LDL below 100 for people at risk, below 70 for very high risk.  HDL above 50 is good, above 60 is best.  Triglycerides below 150. HOW TO LOWER YOUR CHOLESTEROL:  Diet.  Choose fish or white meat chicken and [place], roasted or baked. Limit fatty cuts of red meat, fried foods, and processed meats, such as sausage and lunch meat.  Eat lots of fresh fruits and vegetables. Choose whole grains, beans, pasta, potatoes and cereals.  Use only small amounts of olive, corn or canola oils. Avoid butter, mayonnaise, shortening or palm kernel oils. Avoid foods with trans-fats.  Use skim/nonfat milk and low-fat/nonfat yogurt and cheeses. Avoid whole milk,  cream, ice cream, egg yolks and cheeses. Healthy desserts include angel food cake, ginger snaps, animal crackers, hard candy, popsicles, and low-fat/nonfat frozen yogurt. Avoid pastries, cakes, pies and cookies.  Exercise.  A regular program helps decrease LDL and raises HDL.  Helps with weight control.  Do things that increase your activity level like gardening, walking, or taking the stairs.  Medication.  May be prescribed by your caregiver to help lowering cholesterol and the risk for heart disease.  You may need medicine even if your levels are normal if you have several risk factors. HOME CARE INSTRUCTIONS   Follow your diet and exercise programs as suggested by your caregiver.  Take medications as directed.  Have blood work done when your caregiver feels it is necessary. MAKE SURE YOU:   Understand these instructions.  Will watch your condition.  Will get help right away if you are not doing well or get worse. Document Released: 03/24/2001 Document Revised: 09/21/2011 Document Reviewed: 09/14/2007 [facility] Patient Information 2014 [place], [place]. Vitamin D Deficiency Vitamin D is an important vitamin that your body needs. Having too little of it in your body is called a deficiency. A very bad deficiency can make your bones soft and can cause a condition called rickets.  Vitamin D is important to your body for different reasons, such as:  It helps your body absorb 2 minerals called calcium and phosphorus. It helps make your bones healthy. It may prevent some diseases, such as diabetes and  multiple sclerosis. It helps your muscles and heart. You can get vitamin D in several ways. It is a natural part of some foods. The vitamin is also added to some dairy products and cereals. Some people take vitamin D supplements. Also, your body makes vitamin D when you are in the sun. It changes the sun's rays into a form of the vitamin that your body can use. CAUSES  Not eating enough  foods that contain vitamin D. Not getting enough sunlight. Having certain digestive system diseases that make it hard to absorb vitamin D. These diseases include Crohn's disease, chronic pancreatitis, and cystic fibrosis. Having a surgery in which part of the stomach or small intestine is removed. Being obese. Fat cells pull vitamin D out of your blood. That means that obese people may not have enough vitamin D left in their blood and in other body tissues. Having chronic kidney or liver disease. RISK FACTORS Risk factors are things that make you more likely to develop a vitamin D deficiency. They include: Being older. Not being able to get outside very much. Living in a nursing home. Having had broken bones. Having weak or thin bones (osteoporosis). Having a disease or condition that changes how your body absorbs vitamin D. Having dark skin. Some medicines such as seizure medicines or steroids. Being overweight or obese. SYMPTOMS Mild cases of vitamin D deficiency may not have any symptoms. If you have a very bad case, symptoms may include: Bone pain. Muscle pain. Falling often. Broken bones caused by a minor injury, due to osteoporosis. DIAGNOSIS A blood test is the best way to tell if you have a vitamin D deficiency. TREATMENT Vitamin D deficiency can be treated in different ways. Treatment for vitamin D deficiency depends on what is causing it. Options include: Taking vitamin D supplements. Taking a calcium supplement. Your caregiver will suggest what dose is best for you. HOME CARE INSTRUCTIONS Take any supplements that your caregiver prescribes. Follow the directions carefully. Take only the suggested amount. Have your blood tested 2 months after you start taking supplements. Eat foods that contain vitamin D. Healthy choices include: Fortified dairy products, cereals, or juices. Fortified means vitamin D has been added to the food. Check the label on the package to be  sure. Fatty fish like salmon or trout. Eggs. Oysters. Do not use a tanning bed. Keep your weight at a healthy level. Lose weight if you need to. Keep all follow-up appointments. Your caregiver will need to perform blood tests to make sure your vitamin D deficiency is going away. SEEK MEDICAL CARE IF: You have any questions about your treatment. You continue to have symptoms of vitamin D deficiency. You have nausea or vomiting. You are constipated. You feel confused. You have severe abdominal or back pain. MAKE SURE YOU: Understand these instructions. Will watch your condition. Will get help right away if you are not doing well or get worse. Document Released: 09/21/2011 Document Revised: 10/24/2012 Document Reviewed: 09/21/2011 [facility] Patient Information 2014 [place], [place]. Hypertension As your heart beats, it forces blood through your arteries. This force is your blood pressure. If the pressure is too high, it is called hypertension (HTN) or high blood pressure. HTN is dangerous because you may have it and not know it. High blood pressure may mean that your heart has to work harder to pump blood. Your arteries may be narrow or stiff. The extra work puts you at risk for heart disease, stroke, and other problems.  Blood pressure  consists of two numbers, a higher number over a lower, 110/72, for example. It is stated as "110 over 72." The ideal is below 120 for the top number (systolic) and under 80 for the bottom (diastolic). Write down your blood pressure today. You should pay close attention to your blood pressure if you have certain conditions such as: Heart failure. Prior heart attack. Diabetes Chronic kidney disease. Prior stroke. Multiple risk factors for heart disease. To see if you have HTN, your blood pressure should be measured while you are seated with your arm held at the level of the heart. It should be measured at least twice. A one-time elevated blood pressure reading  (especially in the Emergency Department) does not mean that you need treatment. There may be conditions in which the blood pressure is different between your right and left arms. It is important to see your caregiver soon for a recheck. Most people have essential hypertension which means that there is not a specific cause. This type of high blood pressure may be lowered by changing lifestyle factors such as: Stress. Smoking. Lack of exercise. Excessive weight. Drug/tobacco/alcohol use. Eating less salt. Most people do not have symptoms from high blood pressure until it has caused damage to the body. Effective treatment can often prevent, delay or reduce that damage. TREATMENT  When a cause has been identified, treatment for high blood pressure is directed at the cause. There are a large number of medications to treat HTN. These fall into several categories, and your caregiver will help you select the medicines that are best for you. Medications may have side effects. You should review side effects with your caregiver. If your blood pressure stays high after you have made lifestyle changes or started on medicines,  Your medication(s) may need to be changed. Other problems may need to be addressed. Be certain you understand your prescriptions, and know how and when to take your medicine. Be sure to follow up with your caregiver within the time frame advised (usually within two weeks) to have your blood pressure rechecked and to review your medications. If you are taking more than one medicine to lower your blood pressure, make sure you know how and at what times they should be taken. Taking two medicines at the same time can result in blood pressure that is too low. SEEK IMMEDIATE MEDICAL CARE IF: You develop a severe headache, blurred or changing vision, or confusion. You have unusual weakness or numbness, or a faint feeling. You have severe chest or abdominal pain, vomiting, or breathing  problems. MAKE SURE YOU:  Understand these instructions. Will watch your condition. Will get help right away if you are not doing well or get worse. Document Released: 06/29/2005 Document Revised: 09/21/2011 Document Reviewed: 02/17/2008 [facility] Patient Information 2014 [place], [place]. Diabetes and Exercise Exercising regularly is important. It is not just about losing weight. It has many health benefits, such as: Improving your overall fitness, flexibility, and endurance. Increasing your bone density. Helping with weight control. Decreasing your body fat. Increasing your muscle strength. Reducing stress and tension. Improving your overall health. People with diabetes who exercise gain additional benefits because exercise: Reduces appetite. Improves the body's use of blood sugar (glucose). Helps lower or control blood glucose. Decreases blood pressure. Helps control blood lipids (such as cholesterol and triglycerides). Improves the body's use of the hormone insulin by: Increasing the body's insulin sensitivity. Reducing the body's insulin needs. Decreases the risk for heart disease because exercising: Lowers cholesterol and triglycerides  levels. Increases the levels of good cholesterol (such as high-density lipoproteins [HDL]) in the body. Lowers blood glucose levels. YOUR ACTIVITY PLAN  Choose an activity that you enjoy and set realistic goals. Your health care provider or diabetes educator can help you make an activity plan that works for you. You can break activities into 2 or 3 sessions throughout the day. Doing so is as good as one long session. Exercise ideas include: Taking the dog for a walk. Taking the stairs instead of the elevator. Dancing to your favorite song. Doing your favorite exercise with a friend. RECOMMENDATIONS FOR EXERCISING WITH TYPE 1 OR TYPE 2 DIABETES  Check your blood glucose before exercising. If blood glucose levels are greater than 240 mg/dL, check  for urine ketones. Do not exercise if ketones are present. Avoid injecting insulin into areas of the body that are going to be exercised. For example, avoid injecting insulin into: The arms when playing tennis. The legs when jogging. Keep a record of: Food intake before and after you exercise. Expected peak times of insulin action. Blood glucose levels before and after you exercise. The type and amount of exercise you have done. Review your records with your health care provider. Your health care provider will help you to develop guidelines for adjusting food intake and insulin amounts before and after exercising. If you take insulin or oral hypoglycemic agents, watch for signs and symptoms of hypoglycemia. They include: Dizziness. Shaking. Sweating. Chills. Confusion. Drink plenty of water while you exercise to prevent dehydration or heat stroke. Body water is lost during exercise and must be replaced. Talk to your health care provider before starting an exercise program to make sure it is safe for you. Remember, almost any type of activity is better than none. Document Released: 09/19/2003 Document Revised: 03/01/2013 Document Reviewed: 12/06/2012 [facility] Patient Information 2014 [place], [place].

## 2013-05-18 NOTE — Progress Notes (Signed)
Patient ID: Timothy Boyer, male   DOB: 1946/05/30, 67 y.o.   MRN: [id number]  Complete Physical HPI Patient presents for complete physical.  Patient's blood pressure has been controlled at home. Patient denies chest pain, shortness of breath, dizziness. Patient did have normal heart cath in march of this year. Patient's cholesterol is diet controlled/is on no current medicines. He does report unusual sx's of tremor in the past with lipitor and discontinued it. and denies myalgias. His cholesterol last visit was 149, trig 93 and LDL98. He has been working on diet and exercise for diabetes He denies changes in vision, diabetic polys, and paresthesias. Last A1C in office was 6.8% in February 2014. Also, patient has Vitamin D deficiency and supplements Vitamin D.  .  Current outpatient prescriptions:aspirin 81 MG tablet, Take 81 mg by mouth daily., Disp: , Rfl: ;   benazepril-hydrochlorthiazide (LOTENSIN HCT) 20-12.5 MG per tablet, 2 tabs po qd, Disp: , Rfl: ;   citalopram (CELEXA) 40 MG tablet, Take 40 mg by mouth daily., Disp: , Rfl: ;   finasteride (PROSCAR) 5 MG tablet, 1/2 tab po qd, Disp: , Rfl: ;  furosemide (LASIX) 40 MG tablet, Take 40 mg by mouth daily., Disp: , Rfl:  gabapentin (NEURONTIN) 300 MG capsule, 2 am 1 pm, Disp: , Rfl: ;   metFORMIN (GLUCOPHAGE) 1000 MG tablet, Take 1,000 mg by mouth 2 (two) times daily with a meal., Disp: , Rfl: ;   minoxidil (LONITEN) 10 MG tablet, Take 10 mg by mouth daily., Disp: , Rfl: ;   omeprazole (PRILOSEC) 20 MG capsule, Take 20 mg by mouth daily., Disp: , Rfl: ;   Probiotic Product (PROBIOTIC DAILY PO), Take by mouth daily., Disp: , Rfl:  VITAMIN D, CHOLECALCIFEROL, PO, Take 1 tablet by mouth daily., Disp: , Rfl: ;   ALBUTEROL IN, Inhale into the lungs as needed., Disp: , Rfl: ;   bisoprolol-hydrochlorothiazide (ZIAC) 10-6.25 MG per tablet, Take 1 tablet by mouth daily., Disp: 90 tablet, Rfl: 99;   naproxen (NAPROSYN) 500 MG tablet, Take 1 tablet (500  mg total) by mouth as needed., Disp: 60 tablet, Rfl: 2 potassium chloride SA (K-DUR,KLOR-CON) 20 MEQ tablet, Take 1 tablet (20 mEq total) by mouth daily., Disp: 90 tablet, Rfl: 3;   pravastatin (PRAVACHOL) 40 MG tablet, Take 1 tablet (40 mg total) by mouth every evening., Disp: 90 tablet, Rfl: 11.  Allergies  Allergen Reactions  . Augmentin [Amoxicillin-Pot Clavulanate]   . Lipitor [Atorvastatin]     tremor  . Lopid [Gemfibrozil]   . Lotensin [Benazepril Hcl]   . Penicillins   . Singulair [Montelukast Sodium]    Past Medical History  Diagnosis Date  . DM   . GERD   . HTN (hypertension)   . Obesity   . Hyperlipidemia   . Nephrolithiasis   . Vitamin D deficiency    Past Surgical History  Procedure Laterality Date  . Back surgery    . Carpel tunnal    . Elbow surgery     Family History  Problem Relation Age of Onset  . Heart disease Brother     Valve surgery  . Heart disease Brother   . COPD Mother   . Heart disease Mother   . Heart disease Father   . Cancer Father     pancreas   History   Social History  . Marital Status: Married    Spouse Name: N/A    Number of Children: 1  . Years of Education:  N/A   Occupational History  .      Mechanic   Social History Main Topics  . Smoking status: Never Smoker   . Smokeless tobacco: Not on file  . Alcohol Use: No     Comment: Rare  . Drug Use: No  . Sexual Activity: Not on file   Other Topics Concern  . Not on file   Social History Narrative  . No narrative on file   ROS Constitutional: Denies fever, chills, weight loss/gain, headaches, insomnia, fatigue, night sweats, and change in appetite. Eyes: Denies redness, blurred vision, diplopia, discharge, itchy, watery eyes.  ENT: Denies discharge, congestion, has occasional post nasal drip, denying epistaxis, sore throat, earache, hearing loss, dental pain, Tinnitus, Vertigo, Sinus pain, snoring.  Cardio: Denies chest pain, palpitations, irregular heartbeat,  syncope, dyspnea, diaphoresis, orthopnea, PND, claudication, edema Respiratory: denies cough, dyspnea, DOE, pleurisy, hoarseness, laryngitis, wheezing.  Gastrointestinal: Denies dysphagia, heartburn, reflux, water brash, pain, cramps, nausea, vomiting, bloating, diarrhea, constipation, hematemesis, melena, hematochezia, jaundice, hemorrhoids Genitourinary: Denies dysuria, frequency, urgency, has nocturia 1-2x, denies hesitancy, discharge, hematuria, flank pain. Hx/o soreness and burning disconfort of the left testicle for which he saw a urologist who apparently reassured him that it related to his prior vasectomy. Musculoskeletal: Denies arthralgia, myalgia, stiffness, Jt. Swelling, pain, limp, and strain/sprain. Skin: Denies puritis, rash, hives, warts, acne, eczema, changing in skin lesion Neuro: Weakness, tremor, incoordination, spasms, paresthesia, pain Psychiatric: Denies confusion, memory loss, sensory loss Endocrine: Denies change in weight, skin, hair change, nocturia, and paresthesia, Diabetic Polys, visual blurring, hyper /hypo glycemic episodes.  Heme/Lymph: Excessive bleeding, bruising, enlarged lymph nodes Filed Vitals:   05/18/13 1130  BP: 102/64  Pulse: 56  Temp: 97.9 F (36.6 C)  Resp: 18   Physical Exam: General Appearance: Well nourished, in no apparent distress. Eyes: PERRLA, EOMs, conjunctiva no swelling or erythema, normal fundi and vessels. Sinuses: No Frontal/maxillary tenderness ENT/Mouth: Ext aud canals clear, normal light reflex with TMs without erythema, bulging. Nares clear with no erythema, swelling, mucus on turbinates. No ulcers, cracking, on lips. Good dentition. No erythema, swelling, or exudate on post pharynx. Tongue normal, non-obstructing. Tonsils not swollen or erythematous. Hearing normal.  Neck: Supple, thyroid normal. No bruits or JVD. Respiratory: Respiratory effort normal, BS equal bilaterally without rales, rhonci, wheezing or stridor. Cardio:  Heart sounds normal, regular rate and rhythm without murmurs, rubs or gallops. Peripheral pulses brisk and equal bilaterally, without edema. No aortic or femoral bruits. Chest: symmetric, with normal excursions and percussion. Abdomen: Flat, soft, with bowl sounds. Nontender, no guarding, rebound, hernias, masses, or organomegaly.  Lymphatics: Non tender without lymphadenopathy.  Genitourinary: testes sl. Atrophic L>R. DRE neg- prostate 1+ and smooth w/o nodules. Musculoskeletal: Full ROM all peripheral extremities, joint stability, 5/5 strength, and normal gait. Skin: Warm, dry without rashes, lesions, ecchymosis.  Neuro: Cranial nerves intact, reflexes equal bilaterally. Normal muscle tone, no cerebellar symptoms. Sensation intact.  Pysch: Awake and oriented X 3, normal affect, Insight and Judgment appropriate.   Assessment and Plan  1. HTN 2. Hyperlipidemia 3. NIDDM 4. Vitamin D Deficiency 5. Obesity   Changing Zebeta to g Ziac 10 for $$ concerns. Start Pravastatin 40 mg pending labs                                      +

## 2013-05-19 LAB — HEPATIC FUNCTION PANEL
Albumin: 3.8 g/dL (ref 3.5–5.2)
Alkaline Phosphatase: 49 U/L (ref 39–117)
Total Protein: 6.2 g/dL (ref 6.0–8.3)

## 2013-05-19 LAB — URINALYSIS, COMPLETE
Bacteria, UA: NONE SEEN
Bilirubin Urine: NEGATIVE
Hgb urine dipstick: NEGATIVE
Ketones, ur: NEGATIVE mg/dL
Protein, ur: NEGATIVE mg/dL
Squamous Epithelial / LPF: NONE SEEN
Urobilinogen, UA: 0.2 mg/dL (ref 0.0–1.0)

## 2013-05-19 LAB — MICROALBUMIN / CREATININE URINE RATIO
Creatinine, Urine: 168.8 mg/dL
Microalb Creat Ratio: 7 mg/g (ref 0.0–30.0)
Microalb, Ur: 1.18 mg/dL (ref 0.00–1.89)

## 2013-05-19 LAB — INSULIN, FASTING: Insulin fasting, serum: 12 u[IU]/mL (ref 3–28)

## 2013-05-19 LAB — BASIC METABOLIC PANEL WITH GFR
CO2: 31 mEq/L (ref 19–32)
Glucose, Bld: 111 mg/dL — ABNORMAL HIGH (ref 70–99)
Potassium: 4.2 mEq/L (ref 3.5–5.3)
Sodium: 140 mEq/L (ref 135–145)

## 2013-05-19 LAB — LIPID PANEL
HDL: 29 mg/dL — ABNORMAL LOW (ref >39)
LDL Cholesterol: 94 mg/dL (ref 0–99)
Triglycerides: 158 mg/dL — ABNORMAL HIGH (ref <150)
VLDL: 32 mg/dL (ref 0–40)

## 2013-05-19 LAB — MAGNESIUM: Magnesium: 1.7 mg/dL (ref 1.5–2.5)

## 2013-06-05 ENCOUNTER — Other Ambulatory Visit: Payer: Self-pay | Admitting: Internal Medicine

## 2013-06-05 ENCOUNTER — Telehealth: Payer: Self-pay | Admitting: Internal Medicine

## 2013-06-05 DIAGNOSIS — E782 Mixed hyperlipidemia: Secondary | ICD-10-CM

## 2013-06-05 MED ORDER — EZETIMIBE 10 MG PO TABS: 10.0000 mg | ORAL_TABLET | Freq: Every day | ORAL | Status: DC

## 2013-06-05 NOTE — Telephone Encounter (Signed)
PTS WIFE Timothy Boyer CALLED AND SAID THAT ON PTS LAST VISIT NOTHING WAS FILLED AND ZETIA IS NOT EVEN LISTED WITH [facility] AS ONE OF MEDS.  ALL NEED REFILLED  NEXT APPT:06-13-13 NURSE VISIT  PHARM: [facility]

## 2013-06-05 NOTE — Telephone Encounter (Signed)
rx for zetia sent to [facility] to take along with pravastatin, because his cholesterol is so high & he's high risk for heart attack or stroke

## 2013-06-13 ENCOUNTER — Ambulatory Visit (INDEPENDENT_AMBULATORY_CARE_PROVIDER_SITE_OTHER): Payer: Medicare Other | Admitting: *Deleted

## 2013-06-13 DIAGNOSIS — Z23 Encounter for immunization: Secondary | ICD-10-CM

## 2013-06-13 MED ORDER — ZOSTER VACCINE LIVE 19400 UNT/0.65ML ~~LOC~~ SOLR: 0.6500 mL | Freq: Once | SUBCUTANEOUS | Status: DC

## 2013-06-22 ENCOUNTER — Other Ambulatory Visit: Payer: Self-pay | Admitting: Physician Assistant

## 2013-06-22 MED ORDER — FINASTERIDE 5 MG PO TABS: ORAL_TABLET | ORAL | Status: DC

## 2013-06-22 MED ORDER — METFORMIN HCL 1000 MG PO TABS: 1000.0000 mg | ORAL_TABLET | Freq: Two times a day (BID) | ORAL | Status: DC

## 2013-08-24 ENCOUNTER — Other Ambulatory Visit: Payer: Self-pay | Admitting: Physician Assistant

## 2013-08-24 ENCOUNTER — Encounter: Payer: Self-pay | Admitting: Physician Assistant

## 2013-08-24 ENCOUNTER — Ambulatory Visit (INDEPENDENT_AMBULATORY_CARE_PROVIDER_SITE_OTHER): Payer: Medicare Other | Admitting: Physician Assistant

## 2013-08-24 VITALS — BP 118/70 | HR 68 | Temp 97.9°F | Resp 16 | Ht 68.0 in | Wt 227.0 lb

## 2013-08-24 DIAGNOSIS — G4730 Sleep apnea, unspecified: Secondary | ICD-10-CM

## 2013-08-24 DIAGNOSIS — E669 Obesity, unspecified: Secondary | ICD-10-CM

## 2013-08-24 DIAGNOSIS — E785 Hyperlipidemia, unspecified: Secondary | ICD-10-CM

## 2013-08-24 DIAGNOSIS — E559 Vitamin D deficiency, unspecified: Secondary | ICD-10-CM | POA: Insufficient documentation

## 2013-08-24 DIAGNOSIS — Z79899 Other long term (current) drug therapy: Secondary | ICD-10-CM

## 2013-08-24 DIAGNOSIS — I10 Essential (primary) hypertension: Secondary | ICD-10-CM

## 2013-08-24 DIAGNOSIS — E782 Mixed hyperlipidemia: Secondary | ICD-10-CM

## 2013-08-24 DIAGNOSIS — E119 Type 2 diabetes mellitus without complications: Secondary | ICD-10-CM

## 2013-08-24 LAB — CBC WITH DIFFERENTIAL/PLATELET
BASOS PCT: 0 % (ref 0–1)
Basophils Absolute: 0 10*3/uL (ref 0.0–0.1)
Eosinophils Absolute: 0.1 10*3/uL (ref 0.0–0.7)
Eosinophils Relative: 2 % (ref 0–5)
HCT: 33.3 % — ABNORMAL LOW (ref 39.0–52.0)
HEMOGLOBIN: 10.7 g/dL — AB (ref 13.0–17.0)
LYMPHS ABS: 1.8 10*3/uL (ref 0.7–4.0)
LYMPHS PCT: 31 % (ref 12–46)
MCH: 24.2 pg — ABNORMAL LOW (ref 26.0–34.0)
MCHC: 32.1 g/dL (ref 30.0–36.0)
MCV: 75.2 fL — ABNORMAL LOW (ref 78.0–100.0)
MONOS PCT: 11 % (ref 3–12)
Monocytes Absolute: 0.6 10*3/uL (ref 0.1–1.0)
NEUTROS ABS: 3.3 10*3/uL (ref 1.7–7.7)
NEUTROS PCT: 56 % (ref 43–77)
Platelets: 207 10*3/uL (ref 150–400)
RBC: 4.43 MIL/uL (ref 4.22–5.81)
RDW: 17 % — ABNORMAL HIGH (ref 11.5–15.5)
WBC: 5.7 10*3/uL (ref 4.0–10.5)

## 2013-08-24 LAB — HEPATIC FUNCTION PANEL
ALT: 10 U/L (ref 0–53)
AST: 12 U/L (ref 0–37)
Albumin: 4.1 g/dL (ref 3.5–5.2)
Alkaline Phosphatase: 47 U/L (ref 39–117)
BILIRUBIN DIRECT: 0.1 mg/dL (ref 0.0–0.3)
BILIRUBIN INDIRECT: 0.4 mg/dL (ref 0.2–1.2)
Total Bilirubin: 0.5 mg/dL (ref 0.2–1.2)
Total Protein: 6.5 g/dL (ref 6.0–8.3)

## 2013-08-24 LAB — LIPID PANEL
Cholesterol: 197 mg/dL (ref 0–200)
HDL: 38 mg/dL — AB (ref >39)
LDL CALC: 124 mg/dL — AB (ref 0–99)
TRIGLYCERIDES: 177 mg/dL — AB (ref <150)
Total CHOL/HDL Ratio: 5.2 Ratio
VLDL: 35 mg/dL (ref 0–40)

## 2013-08-24 LAB — BASIC METABOLIC PANEL WITH GFR
BUN: 20 mg/dL (ref 6–23)
CHLORIDE: 97 meq/L (ref 96–112)
CO2: 34 meq/L — AB (ref 19–32)
Calcium: 9.9 mg/dL (ref 8.4–10.5)
Creat: 1.04 mg/dL (ref 0.50–1.35)
GFR, EST AFRICAN AMERICAN: 86 mL/min
GFR, Est Non African American: 74 mL/min
GLUCOSE: 171 mg/dL — AB (ref 70–99)
POTASSIUM: 4.4 meq/L (ref 3.5–5.3)
SODIUM: 139 meq/L (ref 135–145)

## 2013-08-24 LAB — MAGNESIUM: MAGNESIUM: 1.9 mg/dL (ref 1.5–2.5)

## 2013-08-24 LAB — TSH: TSH: 1.782 u[IU]/mL (ref 0.350–4.500)

## 2013-08-24 LAB — HEMOGLOBIN A1C
Hgb A1c MFr Bld: 7 % — ABNORMAL HIGH (ref <5.7)
MEAN PLASMA GLUCOSE: 154 mg/dL — AB (ref <117)

## 2013-08-24 MED ORDER — DOXAZOSIN MESYLATE 4 MG PO TABS: 4.0000 mg | ORAL_TABLET | Freq: Every day | ORAL | Status: DC

## 2013-08-24 NOTE — Patient Instructions (Addendum)
We want weight loss that will last so you should lose 1-2 pounds a week.  THAT IS IT! Please pick THREE things a month to change. Once it is a habit check off the item. Then pick another three items off the list to become habits.  If you are already doing a habit on the list GREAT!  Cross that item off! o Don't drink your calories. Ie, alcohol, soda, fruit juice, and sweet tea.  o Drink more water. Drink a glass when you feel hungry or before each meal.  o Eat breakfast - Complex carb and protein (likeDannon light and fit yogurt, oatmeal, fruit, eggs, [place] bacon). o Measure your cereal.  Eat no more than one cup a day. (ie [place]) o Eat an apple a day. o Add a vegetable a day. o Try a new vegetable a month. o Use Pam! Stop using oil or butter to cook. o Don't finish your plate or use smaller plates. o Share your dessert. o Eat sugar free Jello for dessert or frozen grapes. o Don't eat 2-3 hours before bed. o Switch to whole wheat bread, pasta, and brown rice. o Make healthier choices when you eat out. No fries! o Pick baked chicken, NOT fried. o Don't forget to SLOW DOWN when you eat. It is not going anywhere.  o Take the stairs. o Park far away in the parking lot o [employer] (or weights) for 10 minutes while watching TV. o Walk at work for 10 minutes during break. o Walk outside 1 time a week with your friend, kids, dog, or significant other. o Start a walking group at [place] the mall as much as you can tolerate.  o Keep a food diary. o Weigh yourself daily. o Walk for 15 minutes 3 days per week. o Cook at home more often and eat out less.  If life happens and you go back to old habits, it is okay.  Just start over. You can do it!   If you experience chest pain, get short of breath, or tired during the exercise, please stop immediately and inform your doctor.    Bad carbs also include fruit juice, alcohol, and sweet tea. These are empty calories that do not signal to  your brain that you are full.   Please remember the good carbs are still carbs which convert into sugar. So please measure them out no more than 1/2-1 cup of rice, oatmeal, pasta, and beans.  Veggies are however free foods! Pile them on.   I like lean protein at every meal such as chicken, [place], pork chops, cottage cheese, etc. Just do not fry these meats and please center your meal around vegetable, the meats should be a side dish.   No all fruit is created equal. Please see the list below, the fruit at the bottom is higher in sugars than the fruit at the top   Peeing at night: Switch the lasix to the morning and try this for 1-2 weeks.  If this does not help please decrease the minoxidil to 1/4 or 1/2 pill at night and add on Doxazosin 4mg . This pill is to help with your prostate, take right before bed, it can make you dizzy when you stand, so we want you to start low at  1/4 pill for 2-3 nights, then  1/2 pill for 2-3 night and then 1 pill at night. The minoxidil increases swelling so decreasing it may help with swelling.   Ask  wife about snoring or funny noises at night? If have sleep apnea, call and we can set up appointment.   Sleep Apnea  Sleep apnea is a sleep disorder characterized by abnormal pauses in breathing while you sleep. When your breathing pauses, the level of oxygen in your blood decreases. This causes you to move out of deep sleep and into light sleep. As a result, your quality of sleep is poor, and the system that carries your blood throughout your body (cardiovascular system) experiences stress. If sleep apnea remains untreated, the following conditions can develop:  High blood pressure (hypertension).  Coronary artery disease.  Inability to achieve or maintain an erection (impotence).  Impairment of your thought process (cognitive dysfunction). There are three types of sleep apnea: 1. Obstructive sleep apnea Pauses in breathing during sleep because of a blocked  airway. 2. Central sleep apnea Pauses in breathing during sleep because the area of the brain that controls your breathing does not send the correct signals to the muscles that control breathing. 3. Mixed sleep apnea A combination of both obstructive and central sleep apnea. RISK FACTORS The following risk factors can increase your risk of developing sleep apnea:  Being overweight.  Smoking.  Having narrow passages in your nose and throat.  Being of older age.  Being male.  Alcohol use.  Sedative and tranquilizer use.  Ethnicity. Among individuals younger than 35 years, African Americans are at increased risk of sleep apnea. SYMPTOMS   Difficulty staying asleep.  Daytime sleepiness and fatigue.  Loss of energy.  Irritability.  Loud, heavy snoring.  Morning headaches.  Trouble concentrating.  Forgetfulness.  Decreased interest in sex. DIAGNOSIS  In order to diagnose sleep apnea, your caregiver will perform a physical examination. Your caregiver may suggest that you take a home sleep test. Your caregiver may also recommend that you spend the night in a sleep lab. In the sleep lab, several monitors record information about your heart, lungs, and brain while you sleep. Your leg and arm movements and blood oxygen level are also recorded. TREATMENT The following actions may help to resolve mild sleep apnea:  Sleeping on your side.   Using a decongestant if you have nasal congestion.   Avoiding the use of depressants, including alcohol, sedatives, and narcotics.   Losing weight and modifying your diet if you are overweight. There also are devices and treatments to help open your airway:  Oral appliances. These are custom-made mouthpieces that shift your lower jaw forward and slightly open your bite. This opens your airway.  Devices that create positive airway pressure. This positive pressure "splints" your airway open to help you breathe better during sleep. The  following devices create positive airway pressure:  Continuous positive airway pressure (CPAP) device. The CPAP device creates a continuous level of air pressure with an air pump. The air is delivered to your airway through a mask while you sleep. This continuous pressure keeps your airway open.  Nasal expiratory positive airway pressure (EPAP) device. The EPAP device creates positive air pressure as you exhale. The device consists of single-use valves, which are inserted into each nostril and held in place by adhesive. The valves create very little resistance when you inhale but create much more resistance when you exhale. That increased resistance creates the positive airway pressure. This positive pressure while you exhale keeps your airway open, making it easier to breath when you inhale again.  Bilevel positive airway pressure (BPAP) device. The BPAP device is used mainly in  patients with central sleep apnea. This device is similar to the CPAP device because it also uses an air pump to deliver continuous air pressure through a mask. However, with the BPAP machine, the pressure is set at two different levels. The pressure when you exhale is lower than the pressure when you inhale.  Surgery. Typically, surgery is only done if you cannot comply with less invasive treatments or if the less invasive treatments do not improve your condition. Surgery involves removing excess tissue in your airway to create a wider passage way. Document Released: 06/19/2002 Document Revised: 10/24/2012 Document Reviewed: 11/05/2011 [facility] Patient Information 2014 [place].

## 2013-08-24 NOTE — Progress Notes (Signed)
Subjective:  Timothy Boyer is a 68 y.o. male who presents for Medicare Annual Wellness Visit and 3 month follow up for HTN, hyperlipidemia, prediabetes, and vitamin D Def.  Date of last medicare wellness visit was unknown  His blood pressure has been controlled at home, today their BP is BP: 118/70 mmHg He denies chest pain, shortness of breath, dizziness.  His cholesterol is diet controlled. In addition they are on Zetia and denies myalgias. His cholesterol is not at goal. The cholesterol last visit was:   Lab Results  Component Value Date   CHOL 155 05/18/2013   HDL 29* 05/18/2013   LDLCALC 94 05/18/2013   TRIG 158* 05/18/2013   CHOLHDL 5.3 05/18/2013   He has been working on diet and exercise for Diabetes, joining [employer], and denies blurry vision, polydipsia, polyphagia and polyuria. 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  Patient is on Vitamin D supplement.  States he has nocturia 3-4 times a night  but after going over medications he takes his lasix at night.   Names of Other Physician/Practitioners you currently use: 1. [facility] here for primary care 2. unknown eye doctor, last visit 06/2012 3. none dentist, last visit 3 years ago   Medical Services you may have received from other than [facility] providers in the past year (date may be approximate) N/A  Current Problems (verified) Patient Active Problem List   Diagnosis Date Noted  . Unspecified vitamin D deficiency 08/24/2013  . Nonspecific abnormal unspecified cardiovascular function study 09/15/2012  . Dyspnea 08/30/2012  . HTN (hypertension)   . Obesity   . Hyperlipidemia   . DM 02/20/2010  . GERD 02/20/2010    Immunization History  Administered Date(s) Administered  . Influenza, High Dose Seasonal PF 05/08/2013  . Pneumococcal Polysaccharide-23 05/08/2013  . Td  07/13/2001  . Zoster 06/13/2013    Screening Tests Health Maintenance  Topic Date Due  . Tetanus/tdap  07/14/2011  . Influenza Vaccine  02/10/2014  . Colonoscopy  07/13/2021  . Pneumococcal Polysaccharide Vaccine Age 31 And Over  Completed  . Zostavax  Completed     Preventative care: Last colonoscopy: 2013  Prior vaccinations: TD or Tdap: 2003  Influenza: 2014 Pneumococcal: 2014 Shingles/Zostavax: 2014  History reviewed: allergies, current medications, past family history, past medical history, past social history, past surgical history and problem list   Risk Factors: Tobacco History  Smoking status  . Never Smoker   Smokeless tobacco  . Not on file   male does not smoke.  Patient is not a former smoker. Are there smokers in your home (other than you)?  No  Alcohol Current alcohol use: none  Caffeine Current caffeine use: coffee 2 /day  Exercise Current exercise habits: The patient does not participate in regular exercise at present.  Current exercise: none  Nutrition/Diet Current diet: in general, an "unhealthy" diet  Cardiac risk factors: advanced age (older than 102 for men, 56 for women), diabetes mellitus, dyslipidemia, hypertension, male gender, obesity (BMI >= 30 kg/m2) and sedentary lifestyle.  Depression Screen Nurse depression screen reviewed.  (Note: if answer to either of the following is "Yes", a more complete depression screening is indicated)   Q1: Over the past two weeks, have you felt down, depressed or hopeless? No  Q2: Over the past two weeks, have you felt little interest or  pleasure in doing things? No  Have you lost interest or pleasure in daily life? No  Do you often feel hopeless? No  Do you cry easily over simple problems? No  Activities of Daily Living Nurse ADLs screen reviewed.  In your present state of health, do you have any difficulty performing the following activities?:  Driving? No Managing money?  No Feeding  yourself? No Getting from bed to chair? No Climbing a flight of stairs? No Preparing food and eating?: No Bathing or showering? No Getting dressed: No Getting to the toilet? No Using the toilet:No Moving around from place to place: No In the past year have you fallen or had a near fall?:No   Are you sexually active?  Yes  Do you have more than one partner?  No  Vision Difficulties: No  Hearing Difficulties: No Do you often ask people to speak up or repeat themselves? No Do you experience ringing or noises in your ears? Sometimes Do you have difficulty understanding soft or whispered voices? No  Cognition  Do you feel that you have a problem with memory? No  Do you often misplace items? No  Do you feel safe at home?  Yes  Advanced directives Does patient have a [place]? No Does patient have a Living Will? No   Objective:     Vision and hearing screens reviewed.   Blood pressure 118/70, pulse 68, temperature 97.9 F (36.6 C), resp. rate 16, height 5\' 8"  (1.727 m), weight 227 lb (102.967 kg). Body mass index is 34.52 kg/(m^2).  General appearance: alert, no distress, WD/WN, male Cognitive Testing  Alert? Yes  Normal Appearance?Yes  Oriented to person? Yes  Place? Yes   Time? Yes  Recall of three objects?  Yes  Can perform simple calculations? Yes  Displays appropriate judgment?Yes  Can read the correct time from a watch face?Yes  HEENT: normocephalic, sclerae anicteric, TMs pearly, nares patent, no discharge or erythema, pharynx normal Oral cavity: MMM, no lesions Neck: supple, no lymphadenopathy, no thyromegaly, no masses Heart: RRR, normal S1, S2, no murmurs Lungs: CTA bilaterally, no wheezes, rhonchi, or rales Abdomen: +bs, soft, non tender, non distended, no masses, no hepatomegaly, no splenomegaly Musculoskeletal: nontender, no swelling, no obvious deformity Extremities: no edema, no cyanosis, no clubbing Pulses: 2+ symmetric, upper  and lower extremities, normal cap refill Neurological: alert, oriented x 3, CN2-12 intact, strength normal upper extremities and lower extremities, sensation normal throughout, DTRs 2+ throughout, no cerebellar signs, gait normal Psychiatric: normal affect, behavior normal, pleasant   Assessment:   Hypertension: Continue medication, monitor blood pressure at home.  Continue DASH diet. Cholesterol: Continue diet and exercise. Check cholesterol.  Diabetes-Continue diet and exercise. Check A1C DM neuropathy- cont Gabapentin, continue daily feet checks.  Vitamin D Def- check level and continue medications.  Nocturia- switch lasix at night, if not better will decrease minoxidil and add doxazosin.  ?Sleep apnea- falling a sleep while eating, fatigue, dry mouth, obesity, HTN, family history, etc   Plan:   During the course of the visit the patient was educated and counseled about appropriate screening and preventive services including:    Td vaccine  Diabetes screening  Glaucoma screening  Nutrition counseling   Advanced directives: has NO advanced directive - not interested in additional information  Screening recommendations, referrals:  Vaccinations: Tdap vaccine Needs, patient left before could get Influenza vaccine up to date Pneumococcal vaccine up to date Shingles vaccine up to date Hep B vaccine  up to date  Nutrition assessed and recommended yes will follow up with [place] here.  Colonoscopy up to date Recommended yearly ophthalmology/optometry visit for glaucoma screening and checkup Recommended yearly dental visit for hygiene and checkup Advanced directives - discussed, not interested at this time.   Conditions/risks identified: Obesity DM Heart risk  Medicare Attestation I have personally reviewed: The patient's medical and social history Their use of alcohol, tobacco or illicit drugs Their current medications and supplements The patient's functional ability  including ADLs,fall risks, home safety risks, cognitive, and hearing and visual impairment Diet and physical activities Evidence for depression or mood disorders  The patient's weight, height, BMI, and visual acuity have been recorded in the chart.  I have made referrals, counseling, and provided education to the patient based on review of the above and I have provided the patient with a written personalized care plan for preventive services.     Vicie Mutters, PA-C   08/24/2013

## 2013-08-25 LAB — VITAMIN D 25 HYDROXY (VIT D DEFICIENCY, FRACTURES): VIT D 25 HYDROXY: 91 ng/mL — AB (ref 30–89)

## 2013-08-25 LAB — IRON AND TIBC
%SAT: 12 % — ABNORMAL LOW (ref 20–55)
Iron: 52 ug/dL (ref 42–165)
TIBC: 451 ug/dL — AB (ref 215–435)
UIBC: 399 ug/dL (ref 125–400)

## 2013-08-25 LAB — FERRITIN: Ferritin: 5 ng/mL — ABNORMAL LOW (ref 22–322)

## 2013-08-25 LAB — VITAMIN B12: Vitamin B-12: 333 pg/mL (ref 211–911)

## 2013-08-25 LAB — INSULIN, FASTING: INSULIN FASTING, SERUM: 15 u[IU]/mL (ref 3–28)

## 2013-09-04 IMAGING — US US RENAL
1 series · 13 of 25 positions shown · non-contrast
Comparison: CT 05/10/2009

RENAL/URINARY TRACT ULTRASOUND

CLINICAL DATA: Hypertension, diabetes, nephrolithiasis.

RENAL/URINARY TRACT ULTRASOUND
RENAL DUPLEX ULTRASOUND
TECHNIQUE: Routine ultrasound examination of the kidneys and
urinary tract was performed.  Duplex and color Doppler ultrasound
was utilized to evaluate blood flow in the renal arteries and
kidneys.

[Series 1: us renal · 13 of 88 slices shown]
[im 1/88]
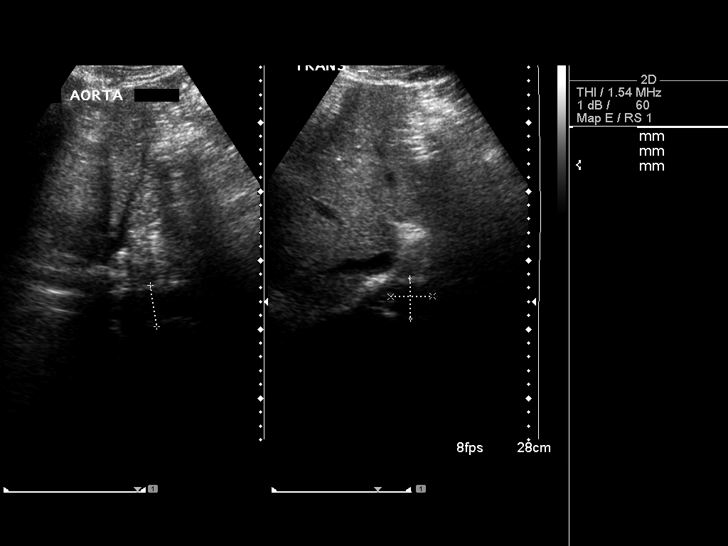
[im 8/88]
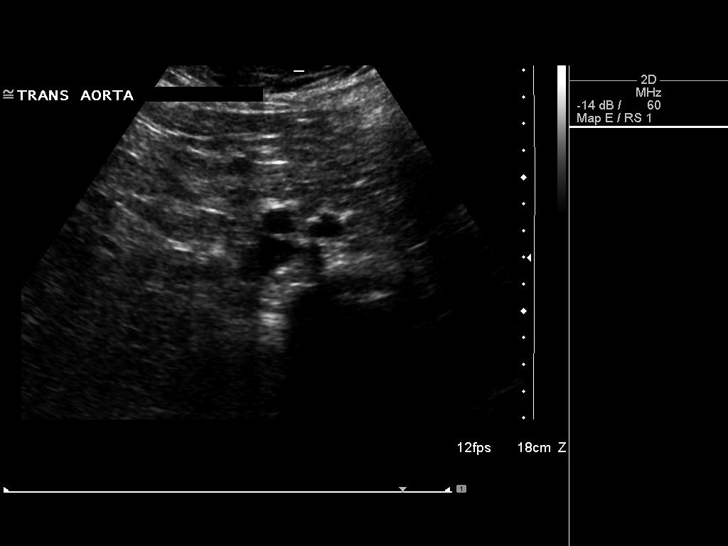
[im 15/88]
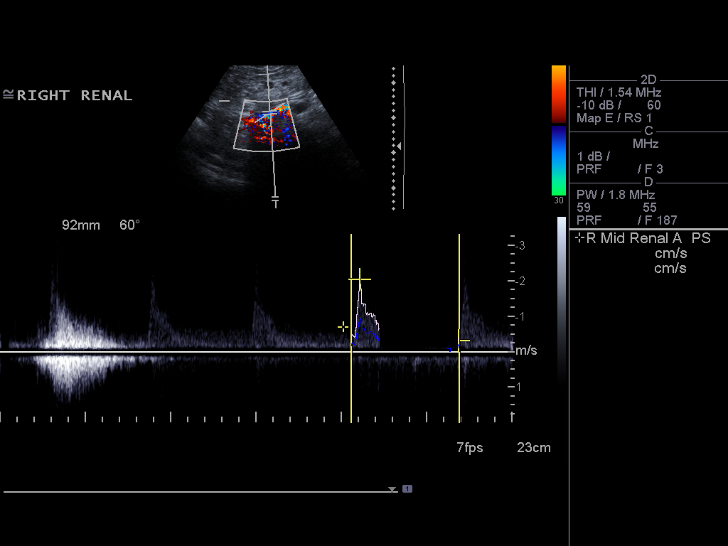
[im 22/88]
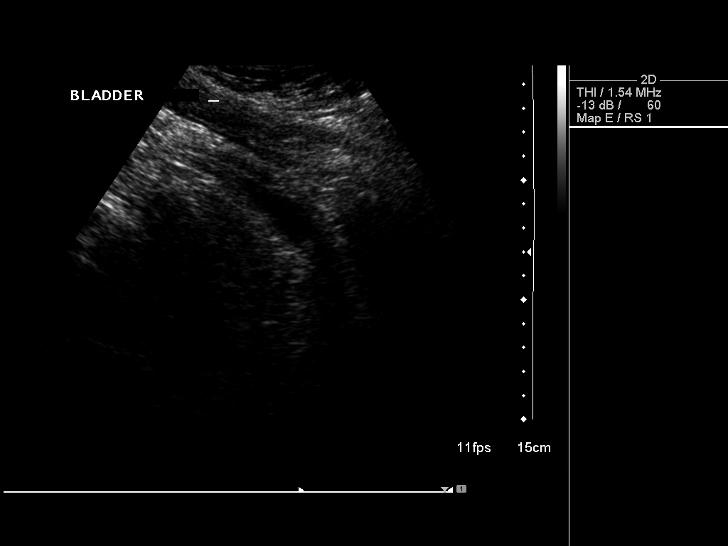
[im 30/88]
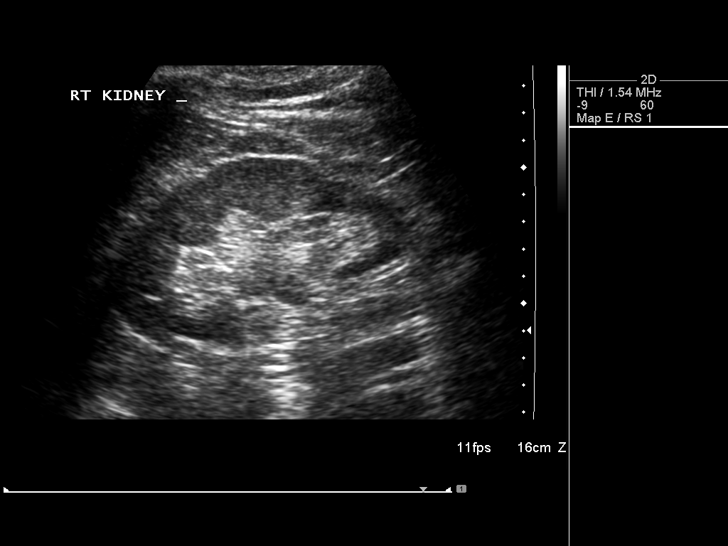
[im 37/88]
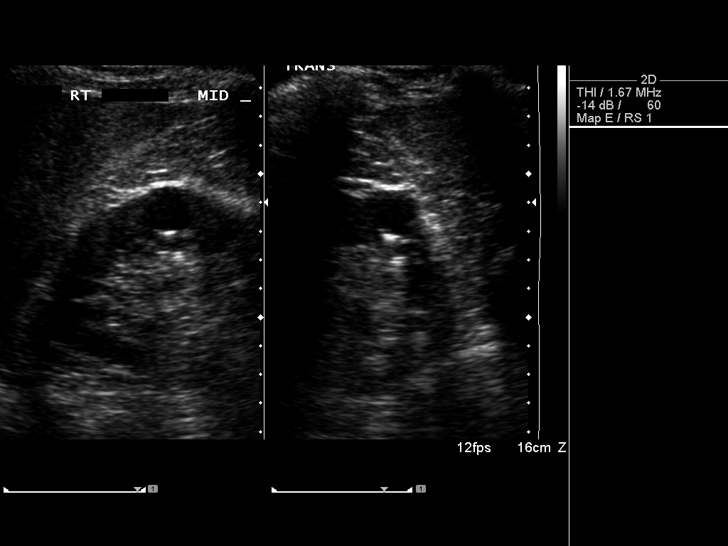
[im 44/88]
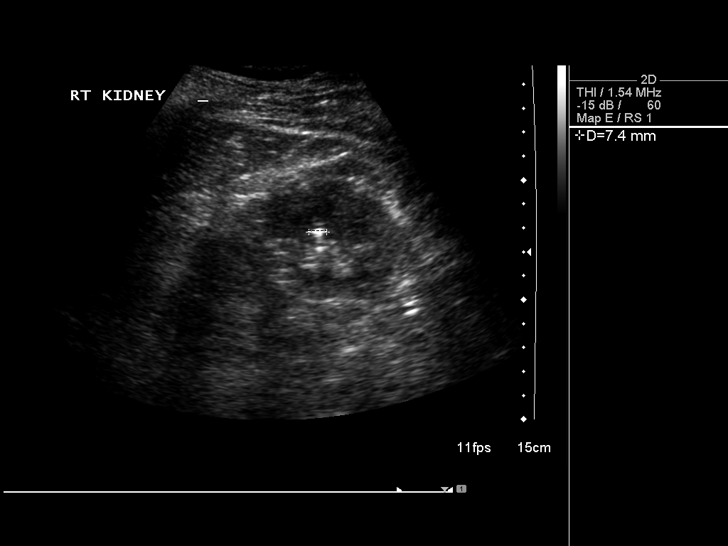
[im 51/88]
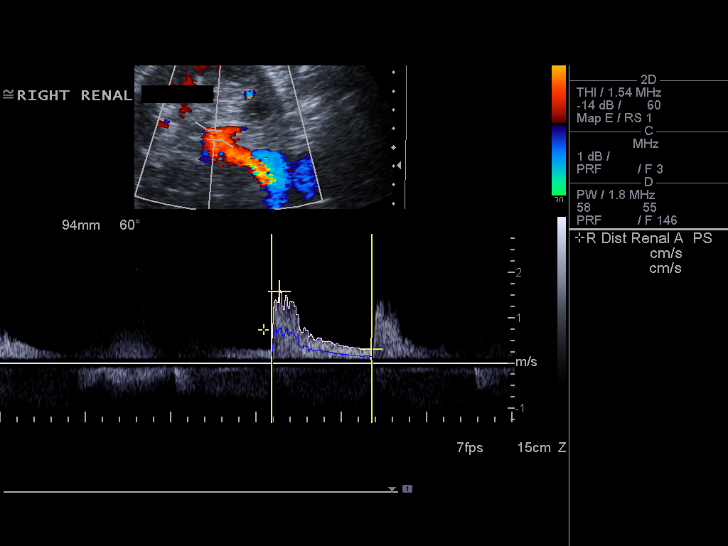
[im 59/88]
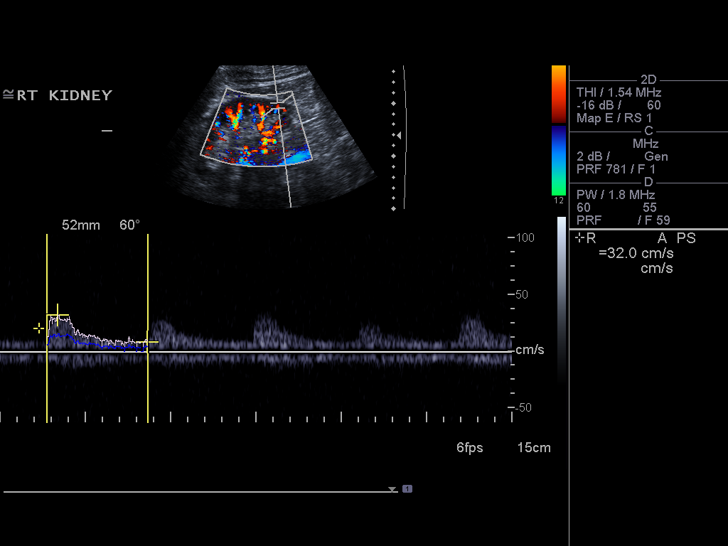
[im 66/88]
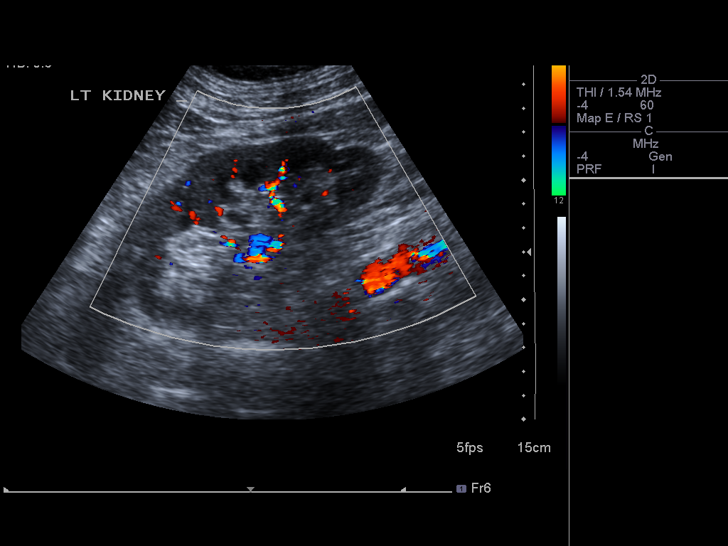
[im 73/88]
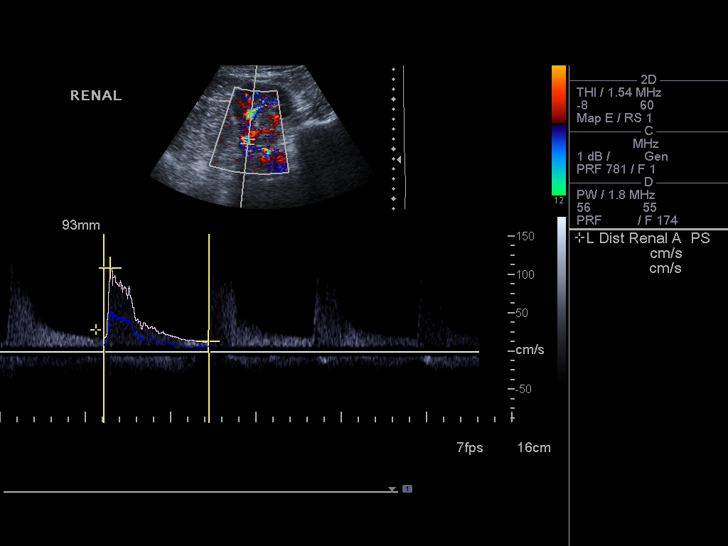
[im 80/88]
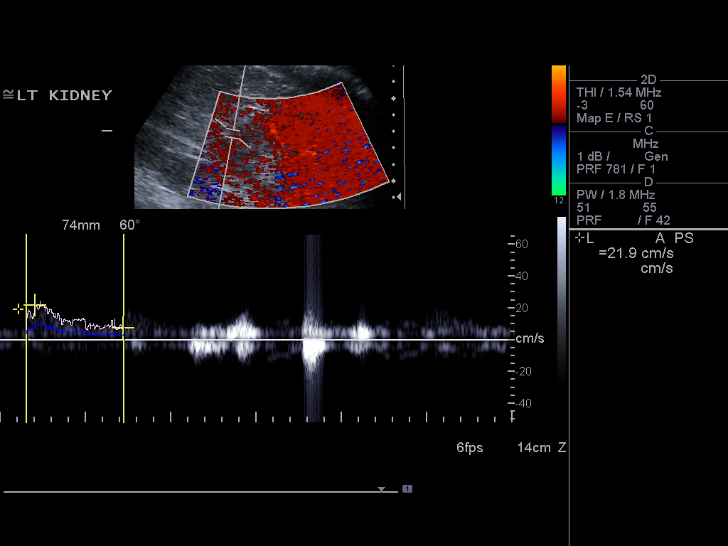
[im 88/88]
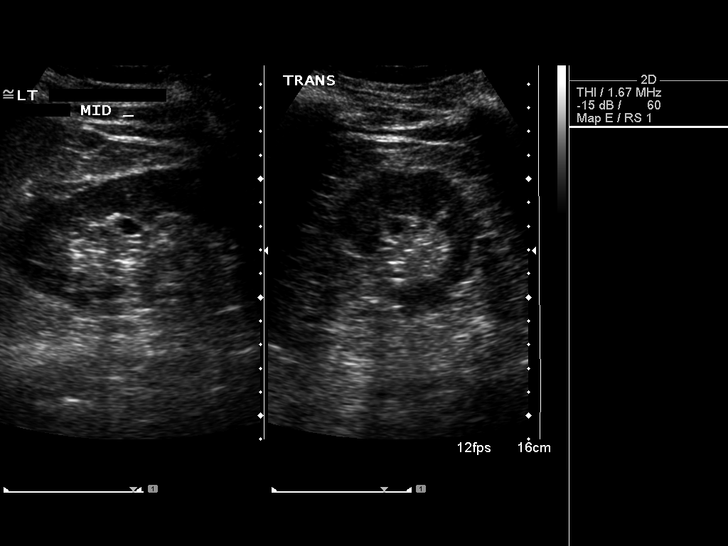

[13 of 25 positions shown; findings below may reference images not displayed]

FINDINGS: Right Kidney:  11.2 cm in length.  Several small   cysts, largest
15 x 16 x 17 mm in the upper pole.  Echogenic foci are noted in the
decompressed renal collecting system.  No solid renal lesion.

Left Kidney:  12.4 cm in length. Upper pole cysts, 9 mm in 12 mm
diameter.  No solid renal lesion or hydronephrosis.

Bladder:  Incompletely distended, unremarkable.

RENAL DUPLEX ULTRASOUND

Right Renal Artery Velocities

Origin: 239 cm/sec
Mid: 213 cm/sec
Hilum: 158 cm/sec
Interlobar: 38 cm/sec
Arcuate: 32 cm/sec

Left Renal Artery Velocities

Origin: 182 cm/sec
Mid: 322 cm/sec
Hilum: 109 cm/sec
Interlobar: 40 cm/sec
Arcuate: 22 cm/sec

Aortic Velocity: 116 cm/sec.  Aorta is atheromatous, measuring 3 cm
in diameter proximally.

Right Renal Aortic Ratios

Origin:2.06
Mid:
Hilum:
Interlobar:

Arcuate: 0.28]

Left Renal Aortic Ratios

Origin:
Mid:
Hilum:
Interlobar:
Arcuate:0.19
FINDINGS: Patency of bilateral renal veins documented.
IMPRESSION: 1.  Focally elevated peak systolic velocities in the mid left renal
artery suggesting hemodynamically significant stenosis. Consider
catheter angiography for further evaluation, as the lesion  may be
amenable to percutaneous treatment if confirmed to be significant.
2.  Benign appearing bilateral renal cysts.
3.  Probable right nephrolithiasis, without hydronephrosis.

## 2013-09-13 ENCOUNTER — Encounter: Payer: Self-pay | Admitting: Internal Medicine

## 2013-09-14 ENCOUNTER — Other Ambulatory Visit: Payer: Self-pay | Admitting: Internal Medicine

## 2013-09-14 ENCOUNTER — Other Ambulatory Visit: Payer: Self-pay | Admitting: *Deleted

## 2013-09-14 DIAGNOSIS — E782 Mixed hyperlipidemia: Secondary | ICD-10-CM

## 2013-09-14 MED ORDER — DOXAZOSIN MESYLATE 4 MG PO TABS: ORAL_TABLET | ORAL | Status: DC

## 2013-09-14 MED ORDER — EZETIMIBE 10 MG PO TABS: 10.0000 mg | ORAL_TABLET | Freq: Every day | ORAL | Status: DC

## 2013-09-14 MED ORDER — BENAZEPRIL-HYDROCHLOROTHIAZIDE 20-12.5 MG PO TABS: 2.0000 | ORAL_TABLET | Freq: Every day | ORAL | Status: DC

## 2013-09-19 IMAGING — CR DG CHEST 2V
2 series · 2 of 2 positions shown · non-contrast
Comparison: 08/04/2005.

CLINICAL DATA: Shortness of breath.

CHEST - 2 VIEW

[view not recorded (1 of 2)]
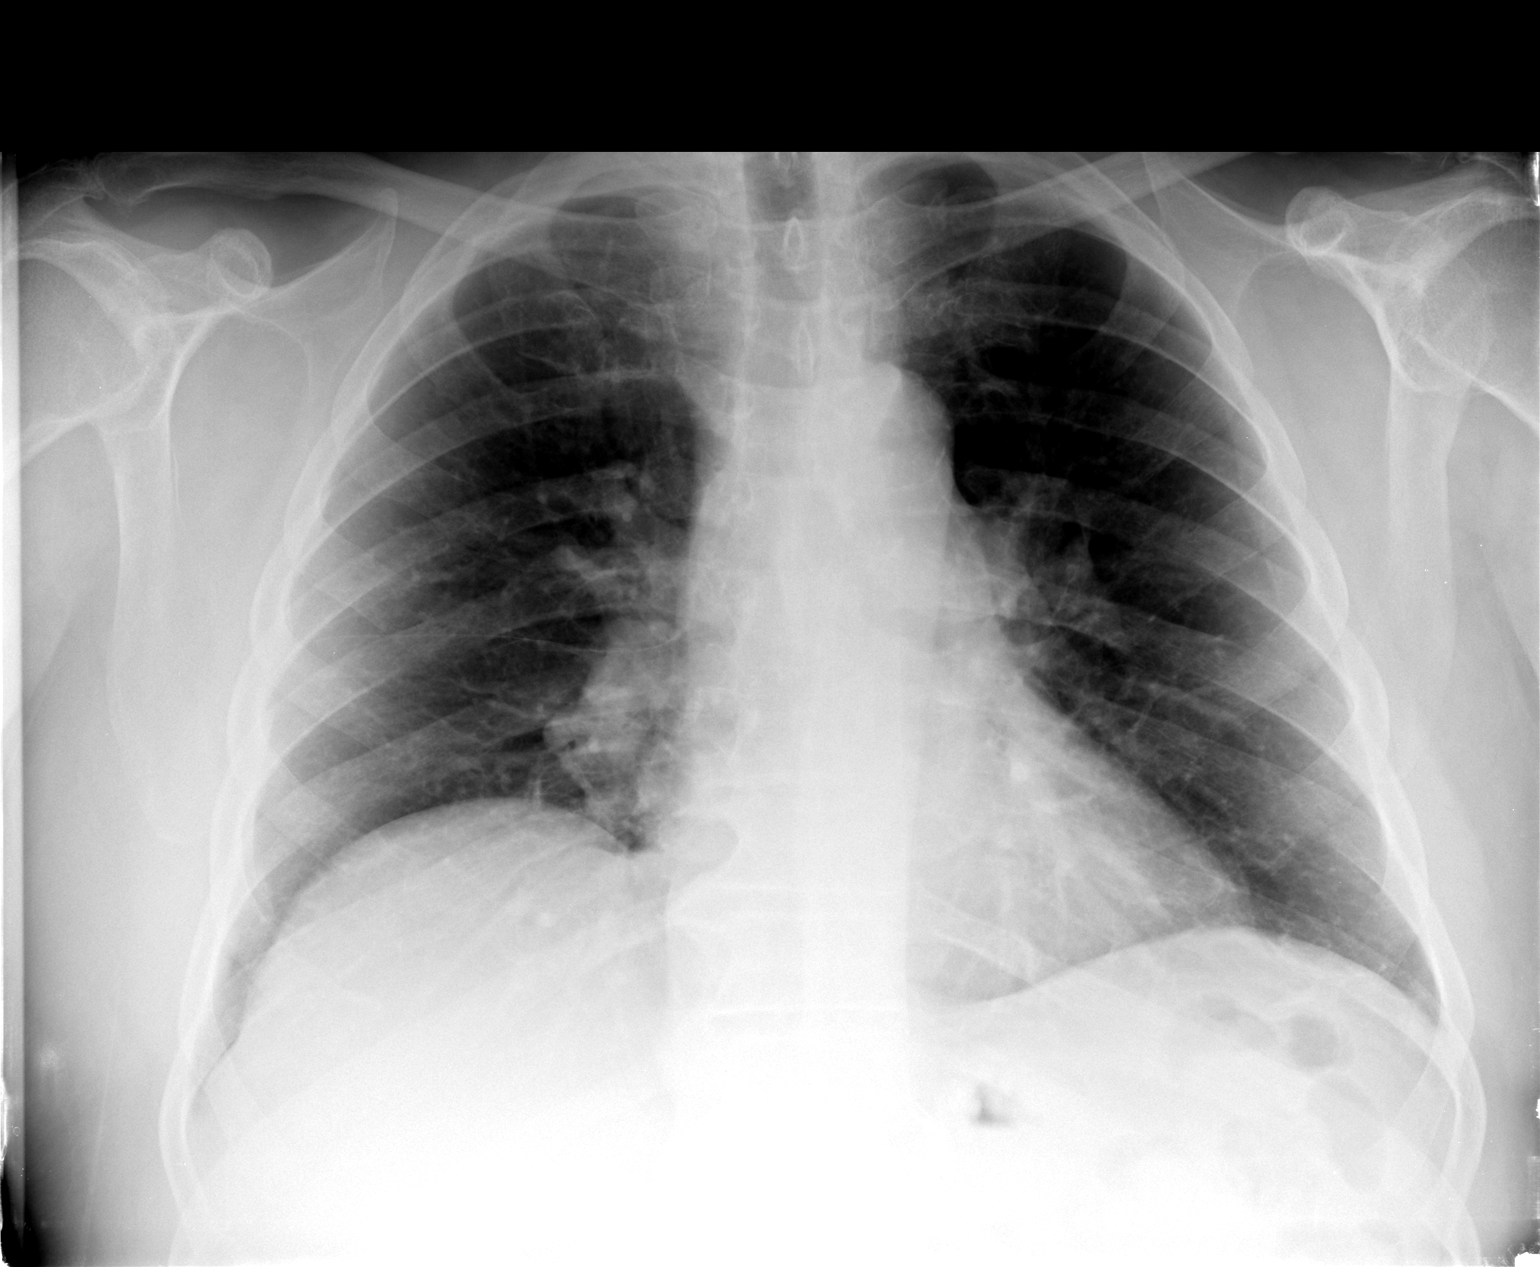

[view not recorded (2 of 2)]
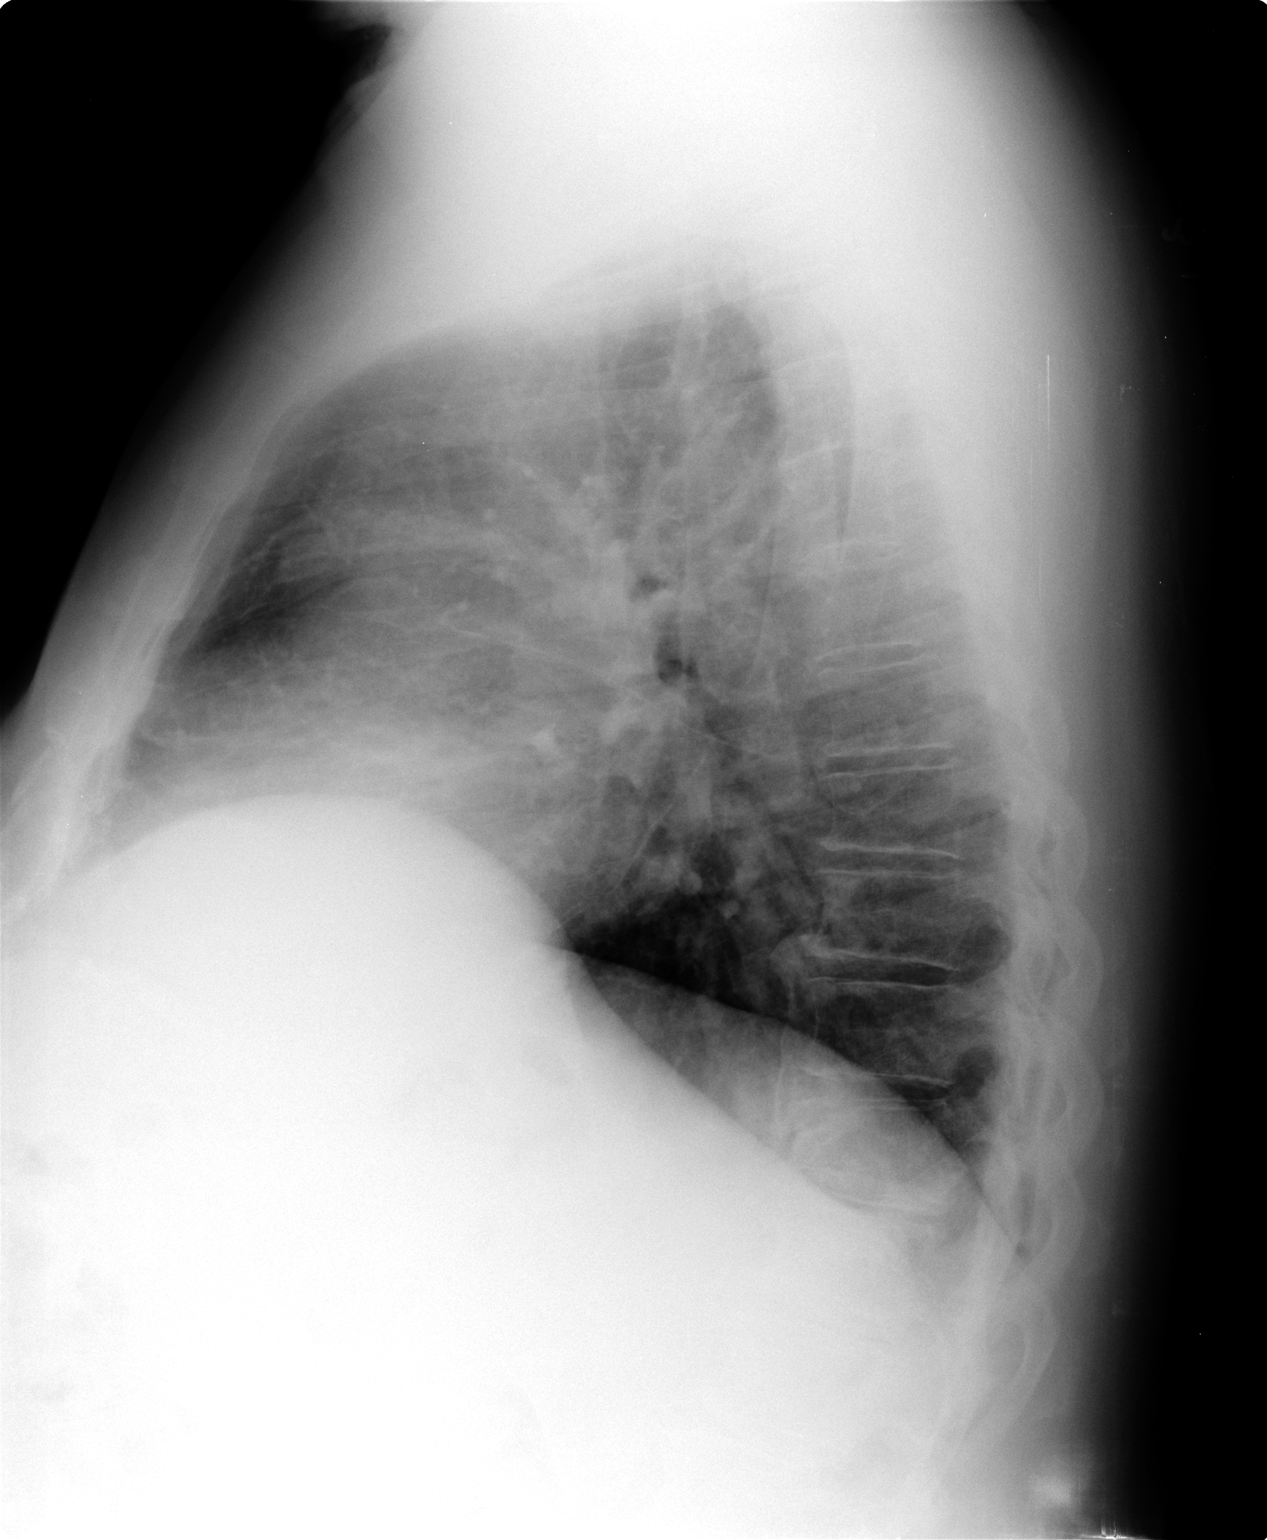

[2 of 2 positions shown; findings below may reference images not displayed]

FINDINGS: Trachea is midline.  Heart size normal.  Lungs are low in
volume but clear.  No pleural fluid.
IMPRESSION: No acute findings.

## 2013-10-11 ENCOUNTER — Telehealth: Payer: Self-pay | Admitting: Internal Medicine

## 2013-10-11 ENCOUNTER — Other Ambulatory Visit: Payer: Self-pay | Admitting: *Deleted

## 2013-10-11 DIAGNOSIS — E782 Mixed hyperlipidemia: Secondary | ICD-10-CM

## 2013-10-11 MED ORDER — METFORMIN HCL 1000 MG PO TABS: 1000.0000 mg | ORAL_TABLET | Freq: Two times a day (BID) | ORAL | Status: DC

## 2013-10-11 MED ORDER — BISOPROLOL-HYDROCHLOROTHIAZIDE 10-6.25 MG PO TABS: 1.0000 | ORAL_TABLET | Freq: Every day | ORAL | Status: DC

## 2013-10-11 MED ORDER — CLONIDINE HCL 0.2 MG PO TABS: 0.2000 mg | ORAL_TABLET | Freq: Every day | ORAL | Status: DC

## 2013-10-11 MED ORDER — OMEPRAZOLE 20 MG PO CPDR: 20.0000 mg | DELAYED_RELEASE_CAPSULE | Freq: Every day | ORAL | Status: DC

## 2013-10-11 MED ORDER — BENAZEPRIL-HYDROCHLOROTHIAZIDE 20-12.5 MG PO TABS: 2.0000 | ORAL_TABLET | Freq: Every day | ORAL | Status: AC

## 2013-10-11 MED ORDER — DOXAZOSIN MESYLATE 4 MG PO TABS: ORAL_TABLET | ORAL | Status: DC

## 2013-10-11 MED ORDER — NAPROXEN 500 MG PO TABS: 500.0000 mg | ORAL_TABLET | ORAL | Status: AC | PRN

## 2013-10-11 MED ORDER — PRAVASTATIN SODIUM 40 MG PO TABS: 40.0000 mg | ORAL_TABLET | Freq: Every evening | ORAL | Status: DC

## 2013-10-11 MED ORDER — MINOXIDIL 10 MG PO TABS: 10.0000 mg | ORAL_TABLET | Freq: Every day | ORAL | Status: DC

## 2013-10-11 MED ORDER — CITALOPRAM HYDROBROMIDE 40 MG PO TABS: 40.0000 mg | ORAL_TABLET | Freq: Every day | ORAL | Status: DC

## 2013-10-11 MED ORDER — EZETIMIBE 10 MG PO TABS
10.0000 mg | ORAL_TABLET | Freq: Every day | ORAL | Status: DC
Start: 2013-10-11 — End: 2014-11-26

## 2013-10-11 MED ORDER — GABAPENTIN 300 MG PO CAPS: 300.0000 mg | ORAL_CAPSULE | Freq: Three times a day (TID) | ORAL | Status: DC

## 2013-10-11 MED ORDER — POTASSIUM CHLORIDE CRYS ER 20 MEQ PO TBCR: 20.0000 meq | EXTENDED_RELEASE_TABLET | Freq: Two times a day (BID) | ORAL | Status: DC

## 2013-10-11 NOTE — Telephone Encounter (Signed)
Starting in 07-2013 patient is now getting rx filled with [facility].  PLEASE SEND AND RESEND, RXS TO [facility] AT [phone].

## 2013-11-08 ENCOUNTER — Ambulatory Visit (HOSPITAL_BASED_OUTPATIENT_CLINIC_OR_DEPARTMENT_OTHER): Payer: Medicare Other

## 2013-11-21 ENCOUNTER — Ambulatory Visit: Payer: Self-pay | Admitting: Internal Medicine

## 2013-11-29 ENCOUNTER — Encounter: Payer: Self-pay | Admitting: Internal Medicine

## 2013-11-29 ENCOUNTER — Ambulatory Visit (INDEPENDENT_AMBULATORY_CARE_PROVIDER_SITE_OTHER): Payer: Commercial Managed Care - HMO | Admitting: Internal Medicine

## 2013-11-29 VITALS — BP 122/72 | HR 60 | Temp 98.1°F | Resp 16 | Ht 68.0 in | Wt 222.8 lb

## 2013-11-29 DIAGNOSIS — E559 Vitamin D deficiency, unspecified: Secondary | ICD-10-CM

## 2013-11-29 DIAGNOSIS — E1129 Type 2 diabetes mellitus with other diabetic kidney complication: Secondary | ICD-10-CM

## 2013-11-29 DIAGNOSIS — I10 Essential (primary) hypertension: Secondary | ICD-10-CM

## 2013-11-29 DIAGNOSIS — Z79899 Other long term (current) drug therapy: Secondary | ICD-10-CM | POA: Insufficient documentation

## 2013-11-29 DIAGNOSIS — E785 Hyperlipidemia, unspecified: Secondary | ICD-10-CM

## 2013-11-29 LAB — CBC WITH DIFFERENTIAL/PLATELET
Basophils Absolute: 0 10*3/uL (ref 0.0–0.1)
Basophils Relative: 0 % (ref 0–1)
EOS ABS: 0.1 10*3/uL (ref 0.0–0.7)
Eosinophils Relative: 2 % (ref 0–5)
HEMATOCRIT: 36.7 % — AB (ref 39.0–52.0)
HEMOGLOBIN: 12.1 g/dL — AB (ref 13.0–17.0)
Lymphocytes Relative: 36 % (ref 12–46)
Lymphs Abs: 1.7 10*3/uL (ref 0.7–4.0)
MCH: 26 pg (ref 26.0–34.0)
MCHC: 33 g/dL (ref 30.0–36.0)
MCV: 78.8 fL (ref 78.0–100.0)
MONO ABS: 0.4 10*3/uL (ref 0.1–1.0)
MONOS PCT: 9 % (ref 3–12)
Neutro Abs: 2.5 10*3/uL (ref 1.7–7.7)
Neutrophils Relative %: 53 % (ref 43–77)
Platelets: 204 10*3/uL (ref 150–400)
RBC: 4.66 MIL/uL (ref 4.22–5.81)
RDW: 17.4 % — ABNORMAL HIGH (ref 11.5–15.5)
WBC: 4.8 10*3/uL (ref 4.0–10.5)

## 2013-11-29 LAB — HEPATIC FUNCTION PANEL
ALT: 12 U/L (ref 0–53)
AST: 13 U/L (ref 0–37)
Albumin: 4.2 g/dL (ref 3.5–5.2)
Alkaline Phosphatase: 47 U/L (ref 39–117)
BILIRUBIN INDIRECT: 0.3 mg/dL (ref 0.2–1.2)
Bilirubin, Direct: 0.1 mg/dL (ref 0.0–0.3)
TOTAL PROTEIN: 7.2 g/dL (ref 6.0–8.3)
Total Bilirubin: 0.4 mg/dL (ref 0.2–1.2)

## 2013-11-29 LAB — BASIC METABOLIC PANEL WITH GFR
BUN: 17 mg/dL (ref 6–23)
CALCIUM: 10.2 mg/dL (ref 8.4–10.5)
CO2: 30 meq/L (ref 19–32)
Chloride: 101 mEq/L (ref 96–112)
Creat: 1.02 mg/dL (ref 0.50–1.35)
GFR, EST AFRICAN AMERICAN: 88 mL/min
GFR, Est Non African American: 76 mL/min
GLUCOSE: 171 mg/dL — AB (ref 70–99)
POTASSIUM: 4.3 meq/L (ref 3.5–5.3)
Sodium: 140 mEq/L (ref 135–145)

## 2013-11-29 LAB — MAGNESIUM: Magnesium: 1.6 mg/dL (ref 1.5–2.5)

## 2013-11-29 LAB — HEMOGLOBIN A1C
Hgb A1c MFr Bld: 7.3 % — ABNORMAL HIGH (ref <5.7)
MEAN PLASMA GLUCOSE: 163 mg/dL — AB (ref <117)

## 2013-11-29 LAB — TSH: TSH: 1.919 u[IU]/mL (ref 0.350–4.500)

## 2013-11-29 LAB — LIPID PANEL
CHOLESTEROL: 168 mg/dL (ref 0–200)
HDL: 38 mg/dL — ABNORMAL LOW (ref >39)
LDL Cholesterol: 107 mg/dL — ABNORMAL HIGH (ref 0–99)
Total CHOL/HDL Ratio: 4.4 Ratio
Triglycerides: 114 mg/dL (ref <150)
VLDL: 23 mg/dL (ref 0–40)

## 2013-11-29 NOTE — Progress Notes (Signed)
Patient ID: Timothy Boyer, male   DOB: [birth year], 68 y.o.   MRN: [id number]    This very nice 68 y.o. MWM presents for 6 month follow up with Hypertension, Hyperlipidemia, Pre-Diabetes and Vitamin D Deficiency.    HTN predates since   . BP has been controlled at home. Today's BP: 122/72 mmHg. Patient denies any cardiac type chest pain, palpitations, dyspnea/orthopnea/PND, dizziness, claudication, or dependent edema.   Hyperlipidemia is not controlled with diet & meds and last Chol was  197 in Feb 2015 and LDL was elevated at 124.  Patient denies myalgias or other med SE's.    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 He reports CBG's are less than 120 mg%. Patient denies any symptoms of reactive hypoglycemia, diabetic polys, paresthesias or visual blurring.   Further, Patient has history of Vitamin D Deficiency with last vitamin D was 91 in Feb 2015. Patient supplements vitamin D without any suspected side-effects.    Medication List       ALBUTEROL IN  Inhale into the lungs as needed.     aspirin 81 MG tablet  Take 81 mg by mouth daily.     benazepril-hydrochlorthiazide 20-12.5 MG per tablet  Commonly known as:  LOTENSIN HCT  Take 2 tablets by mouth daily. Take 2 tablets daily for BP     bisoprolol-hydrochlorothiazide 10-6.25 MG per tablet  Commonly known as:  ZIAC  Take 1 tablet by mouth daily.     citalopram 40 MG tablet  Commonly known as:  CELEXA  Take 1 tablet (40 mg total) by mouth daily.     cloNIDine 0.2 MG tablet  Commonly known as:  CATAPRES  Take 1 tablet (0.2 mg total) by mouth daily.     doxazosin 4 MG tablet  Commonly known as:  CARDURA  Take 1 tablet at bedtime for BP and Prostate     ezetimibe 10 MG tablet  Commonly known as:  ZETIA  Take 1 tablet (10 mg total) by mouth daily. For Cholesterol     finasteride 5 MG tablet   Commonly known as:  PROSCAR  1/2-1 tab po qd     furosemide 40 MG tablet  Commonly known as:  LASIX  Take 40 mg by mouth daily.     gabapentin 300 MG capsule  Commonly known as:  NEURONTIN  Take 1 capsule (300 mg total) by mouth 3 (three) times daily. 2 am 1 pm     metFORMIN 1000 MG tablet  Commonly known as:  GLUCOPHAGE  Take 1 tablet (1,000 mg total) by mouth 2 (two) times daily with a meal.     minoxidil 10 MG tablet  Commonly known as:  LONITEN  Take 1 tablet (10 mg total) by mouth daily.     naproxen 500 MG tablet  Commonly known as:  NAPROSYN  Take 1 tablet (500 mg total) by mouth as needed.     omeprazole 20 MG capsule  Commonly known as:  PRILOSEC  Take 1 capsule (20 mg total) by mouth daily.     potassium chloride SA 20 MEQ tablet  Commonly known as:  K-DUR,KLOR-CON  Take 1 tablet (20 mEq total) by mouth 2 (two) times daily.     pravastatin 40 MG tablet  Commonly known as:  PRAVACHOL  Take 1 tablet (40 mg total) by  mouth every evening.     PROBIOTIC DAILY PO  Take by mouth daily.     VITAMIN D (CHOLECALCIFEROL) PO  Take 1 tablet by mouth daily.     Vitamins A & D 5000-400 UNITS Caps       Allergies  Allergen Reactions  . Augmentin [Amoxicillin-Pot Clavulanate]   . Lipitor [Atorvastatin]     tremor  . Lopid [Gemfibrozil]   . Penicillins   . Singulair [Montelukast Sodium]    PMHx:   Past Medical History  Diagnosis Date  . DM   . GERD   . HTN (hypertension)     Normal Cath 09/2012  . Obesity   . Hyperlipidemia   . Nephrolithiasis   . Vitamin D deficiency   . Neuropathy due to secondary diabetes mellitus    FHx:    Reviewed / unchanged  SHx:    Reviewed / unchanged   Systems Review: Constitutional: Denies fever, chills, wt changes, headaches, insomnia, fatigue, night sweats, change in appetite. Eyes: Denies redness, blurred vision, diplopia, discharge, itchy, watery eyes.  ENT: Denies discharge, congestion, post nasal drip, epistaxis,  sore throat, earache, hearing loss, dental pain, tinnitus, vertigo, sinus pain, snoring.  CV: Denies chest pain, palpitations, irregular heartbeat, syncope, dyspnea, diaphoresis, orthopnea, PND, claudication or edema. Respiratory: denies cough, dyspnea, DOE, pleurisy, hoarseness, laryngitis, wheezing.  Gastrointestinal: Denies dysphagia, odynophagia, heartburn, reflux, water brash, abdominal pain or cramps, nausea, vomiting, bloating, diarrhea, constipation, hematemesis, melena, hematochezia  or hemorrhoids. Genitourinary: Denies dysuria, frequency, urgency, nocturia, hesitancy, discharge, hematuria or flank pain. Musculoskeletal: Denies arthralgias, myalgias, stiffness, jt. swelling, pain, limping or strain/sprain.  Skin: Denies pruritus, rash, hives, warts, acne, eczema or change in skin lesion(s). Neuro: No weakness, tremor, incoordination, spasms, paresthesia or pain. Psychiatric: Denies confusion, memory loss or sensory loss. Endo: Denies change in weight, skin or hair change.  Heme/Lymph: No excessive bleeding, bruising or enlarged lymph nodes.  Exam:  BP 122/72  Pulse 60  Temp 98.1 F   Resp 16  Ht 5\' 8"    Wt 222 lb 12.8 oz   BMI 33.88 kg/m2  Appears over  nourished and in no distress. Eyes: PERRLA, EOMs, conjunctiva no swelling or erythema. Sinuses: No frontal/maxillary tenderness ENT/Mouth: EAC's clear, TM's nl w/o erythema, bulging. Nares clear w/o erythema, swelling, exudates. Oropharynx clear without erythema or exudates. Oral hygiene is good. Tongue normal, non obstructing. Hearing intact.  Neck: Supple. Thyroid nl. Car 2+/2+ without bruits, nodes or JVD. Chest: Respirations nl with BS clear & equal w/o rales, rhonchi, wheezing or stridor.  Cor: Heart sounds normal w/ regular rate and rhythm without sig. murmurs, gallops, clicks, or rubs. Peripheral pulses normal and equal  without edema.  Abdomen: Soft & bowel sounds normal. Non-tender w/o guarding, rebound, hernias,  masses, or organomegaly.   Musculoskeletal: Full ROM all peripheral extremities, joint stability, 5/5 strength, and normal gait.  Skin: Warm, dry without exposed rashes, lesions or ecchymosis apparent.  Neuro: Cranial nerves intact, reflexes equal bilaterally. Sensory-motor testing grossly intact. Tendon reflexes grossly intact.  Pysch: Alert & oriented x 3. Insight and judgement nl & appropriate. No ideations.  Assessment and Plan:  1. Hypertension - Continue monitor blood pressure at home. Continue diet/meds same.  2. Hyperlipidemia - Continue diet/meds, exercise,& lifestyle modifications. Continue monitor periodic cholesterol/liver & renal functions   3. 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  4. Vitamin D  Deficiency - Continue supplementation.  Recommended regular exercise, BP monitoring, weight control, and discussed med and SE's. Recommended labs to assess and monitor clinical status. Further disposition pending results of labs.

## 2013-11-29 NOTE — Patient Instructions (Signed)

## 2013-11-30 LAB — VITAMIN D 25 HYDROXY (VIT D DEFICIENCY, FRACTURES): Vit D, 25-Hydroxy: 79 ng/mL (ref 30–89)

## 2013-11-30 LAB — INSULIN, FASTING: Insulin fasting, serum: 27 u[IU]/mL (ref 3–28)

## 2014-01-11 ENCOUNTER — Encounter: Payer: Self-pay | Admitting: Emergency Medicine

## 2014-01-15 ENCOUNTER — Other Ambulatory Visit: Payer: Self-pay

## 2014-01-15 ENCOUNTER — Telehealth: Payer: Self-pay

## 2014-01-15 ENCOUNTER — Other Ambulatory Visit: Payer: Self-pay | Admitting: Internal Medicine

## 2014-01-15 DIAGNOSIS — R239 Unspecified skin changes: Secondary | ICD-10-CM

## 2014-01-15 MED ORDER — FUROSEMIDE 40 MG PO TABS: 40.0000 mg | ORAL_TABLET | Freq: Every day | ORAL | Status: DC

## 2014-01-15 NOTE — Telephone Encounter (Signed)
Submitted referral to [facility] for patient to see Dr.Whitworth. Patient's wife aware.

## 2014-02-21 ENCOUNTER — Encounter: Payer: Self-pay | Admitting: Internal Medicine

## 2014-02-21 ENCOUNTER — Ambulatory Visit (INDEPENDENT_AMBULATORY_CARE_PROVIDER_SITE_OTHER): Payer: Commercial Managed Care - HMO | Admitting: Internal Medicine

## 2014-02-21 VITALS — BP 116/72 | HR 60 | Temp 98.2°F | Resp 16 | Ht 68.0 in | Wt 223.8 lb

## 2014-02-21 DIAGNOSIS — J209 Acute bronchitis, unspecified: Secondary | ICD-10-CM

## 2014-02-21 MED ORDER — AZITHROMYCIN 250 MG PO TABS
ORAL_TABLET | ORAL | Status: DC
Start: 2014-02-21 — End: 2014-03-11

## 2014-02-21 MED ORDER — PREDNISONE 20 MG PO TABS: ORAL_TABLET | ORAL | Status: DC

## 2014-02-21 MED ORDER — HYDROCODONE-ACETAMINOPHEN 5-325 MG PO TABS: ORAL_TABLET | ORAL | Status: DC

## 2014-02-21 NOTE — Progress Notes (Signed)
Subjective:    Patient ID: Timothy Boyer, male    DOB: July 01, 1946, 68 y.o.   MRN: [id number]  Cough The current episode started in the past 7 days. The problem has been waxing and waning. The problem occurs constantly. The cough is productive of purulent sputum. Associated symptoms include chills, myalgias, nasal congestion, a sore throat and sweats. Pertinent negatives include no ear pain, postnasal drip, rash, rhinorrhea or shortness of breath. He has tried nothing for the symptoms. His past medical history is significant for bronchitis and environmental allergies. There is no history of asthma, bronchiectasis, COPD, emphysema or pneumonia.     Medication List   ALBUTEROL IN  Inhale into the lungs as needed.     aspirin 81 MG tablet  Take 81 mg by mouth daily.     azithromycin 250 MG tablet  Commonly known as:  ZITHROMAX  Take 2 tablets (500 mg) on  Day 1,  followed by 1 tablet (250 mg) once daily on Days 2 through 5.     benazepril-hydrochlorthiazide 20-12.5 MG per tablet  Commonly known as:  LOTENSIN HCT  Take 2 tablets by mouth daily. Take 2 tablets daily for BP     bisoprolol-hydrochlorothiazide 10-6.25 MG per tablet  Commonly known as:  ZIAC  Take 1 tablet by mouth daily.     citalopram 40 MG tablet  Commonly known as:  CELEXA  Take 1 tablet (40 mg total) by mouth daily.     cloNIDine 0.2 MG tablet  Commonly known as:  CATAPRES  Take 1 tablet (0.2 mg total) by mouth daily.     doxazosin 4 MG tablet  Commonly known as:  CARDURA  Take 1 tablet at bedtime for BP and Prostate     ezetimibe 10 MG tablet  Commonly known as:  ZETIA  Take 1 tablet (10 mg total) by mouth daily. For Cholesterol     finasteride 5 MG tablet  Commonly known as:  PROSCAR  1/2-1 tab po qd     furosemide 40 MG tablet  Commonly known as:  LASIX  Take 1 tablet (40 mg total) by mouth daily. For BP & Fluid     gabapentin 300 MG capsule  Commonly known as:  NEURONTIN  Take 1 capsule (300 mg  total) by mouth 3 (three) times daily. 2 am 1 pm     HYDROcodone-acetaminophen 5-325 MG per tablet  Commonly known as:  NORCO  1/2 to 1 q 3-4 H prn     metFORMIN 1000 MG tablet  Commonly known as:  GLUCOPHAGE  Take 1 tablet (1,000 mg total) by mouth 2 (two) times daily with a meal.     minoxidil 10 MG tablet  Commonly known as:  LONITEN  Take 1 tablet (10 mg total) by mouth daily.     naproxen 500 MG tablet  Commonly known as:  NAPROSYN  Take 1 tablet (500 mg total) by mouth as needed.     omeprazole 20 MG capsule  Commonly known as:  PRILOSEC  Take 1 capsule (20 mg total) by mouth daily.     potassium chloride SA 20 MEQ tablet  Commonly known as:  K-DUR,KLOR-CON  Take 1 tablet (20 mEq total) by mouth 2 (two) times daily.     pravastatin 40 MG tablet  Commonly known as:  PRAVACHOL  Take 1 tablet (40 mg total) by mouth every evening.     predniSONE 20 MG tablet  Commonly known as:  DELTASONE  1  tab 3 x day for 3 days, then 1 tab 2 x day for 3 days, then 1 tab 1 x day for 5 days     VITAMIN D (CHOLECALCIFEROL) PO  Take 1 tablet by mouth daily.     Vitamins A & D 5000-400 UNITS Caps       Allergies  Allergen Reactions  . Augmentin [Amoxicillin-Pot Clavulanate]   . Lipitor [Atorvastatin]     tremor  . Lopid [Gemfibrozil]   . Penicillins   . Singulair [Montelukast Sodium]    Past Medical History  Diagnosis Date  . DM   . GERD   . HTN (hypertension)     Normal Cath 09/2012  . Obesity   . Hyperlipidemia   . Nephrolithiasis   . Vitamin D deficiency   . Neuropathy due to secondary diabetes mellitus    Review of Systems  Constitutional: Positive for chills.  HENT: Positive for congestion, sinus pressure and sore throat. Negative for dental problem, drooling, ear discharge, ear pain, facial swelling, hearing loss, mouth sores, nosebleeds, postnasal drip, rhinorrhea and sneezing.   Eyes: Negative.   Respiratory: Positive for cough. Negative for choking, chest  tightness, shortness of breath and stridor.   Cardiovascular: Negative.   Genitourinary: Negative.   Musculoskeletal: Positive for myalgias.  Skin: Negative.  Negative for pallor, rash and wound.  Allergic/Immunologic: Positive for environmental allergies.  Neurological: Negative.     BP 116/72  Pulse 60  Temp(Src) 98.2 F (36.8 C) (Temporal)  Resp 16  Ht 5\' 8"  (1.727 m)  Wt 223 lb 12.8 oz (101.515 kg)  BMI 34.04 kg/m2 Objective:   Physical Exam  Constitutional: He is oriented to person, place, and time. No distress.  HENT:  Right Ear: External ear normal.  Left Ear: External ear normal.  Nose: Nose normal.  Mouth/Throat: Oropharynx is clear and moist. No oropharyngeal exudate.  Eyes: EOM are normal. Pupils are equal, round, and reactive to light. Right eye exhibits no discharge.  Neck: Normal range of motion. Neck supple. No thyromegaly present.  Cardiovascular: Regular rhythm and normal heart sounds.   No murmur heard. Pulmonary/Chest: Effort normal. He has no wheezes. He has rales.  Lymphadenopathy:    He has no cervical adenopathy.  Neurological: He is alert and oriented to person, place, and time. No cranial nerve deficit. Coordination normal.  Skin: Skin is warm and dry. No rash noted. There is erythema. No pallor.   Assessment & Plan:   1. Acute bronchitis, unspecified organism  - azithromycin  250 MG tablet; Take 2 tablets (500 mg) on  Day 1,  followed by 1 tablet (250 mg) once daily on Days 2 through 5.  Dispense: 6 each; Refill: 1 - predniSONE  20 MG tablet; 1 tab 3 x day for 3 days, then 1 tab 2 x day for 3 days, then 1 tab 1 x day for 5 days  Dispense: 20 tablet; Refill: 0 - HYDROcodone-acetaminophen5-325 MG per tablet; 1/2 to 1 q 3-4 H prn  Dispense: 50 tablet; Refill: 0

## 2014-02-21 NOTE — Progress Notes (Deleted)
Patient ID: Timothy Boyer, male   DOB: Jul 30, 1945, 68 y.o.   MRN: [id number]

## 2014-03-01 ENCOUNTER — Ambulatory Visit: Payer: Self-pay | Admitting: Emergency Medicine

## 2014-03-11 NOTE — Progress Notes (Signed)
Patient ID: Timothy Boyer, male   DOB: 20-Mar-1946, 68 y.o.   MRN: [id number]   This very nice 68 y.o.MWM presents for 3 month follow up with Hypertension, Hyperlipidemia, T2_NIDDM w/Stage 2 CKD and Vitamin D Deficiency.    Patient is treated for HTN & BP has been controlled at home. Today's BP: 120/72 mmHg. Patient denies any cardiac type chest pain, palpitations, dyspnea/orthopnea/PND, dizziness, claudication, or dependent edema.   Hyperlipidemia is controlled with diet & meds. Patient denies myalgias or other med SE's. Last Lipids were  Near goal with Chol 168; HDL C 38*; LDL 107*; Trig 114 on 11/29/2013.   Also, the patient has history of T2_NIDDM w/Stage 2 CKD (GFR 74) and patient denies any symptoms of reactive hypoglycemia, diabetic polys, paresthesias or visual blurring.  Last A1c was  7.3% on 11/29/2013.    Further, Patient has history of Vitamin D Deficiency and patient supplements vitamin D without any suspected side-effects. Last vitamin D was 79 on 11/29/2013.   Medication List   ALBUTEROL IN  Inhale into the lungs as needed.     aspirin 81 MG tablet  Take 81 mg by mouth daily.     benazepril-hydrochlorthiazide 20-12.5 MG per tablet  Commonly known as:  LOTENSIN HCT  Take 2 tablets by mouth daily. Take 2 tablets daily for BP     bisoprolol-hydrochlorothiazide 10-6.25 MG per tablet  Commonly known as:  ZIAC  Take 1 tablet by mouth daily.     citalopram 40 MG tablet  Commonly known as:  CELEXA  Take 1 tablet (40 mg total) by mouth daily.     cloNIDine 0.2 MG tablet  Commonly known as:  CATAPRES  Take 1 tablet (0.2 mg total) by mouth daily.     doxazosin 4 MG tablet  Commonly known as:  CARDURA  Take 1 tablet at bedtime for BP and Prostate     ezetimibe 10 MG tablet  Commonly known as:  ZETIA  Take 1 tablet (10 mg total) by mouth daily. For Cholesterol     finasteride 5 MG tablet  Commonly known as:  PROSCAR  1/2-1 tab po qd     furosemide 40 MG tablet  Commonly  known as:  LASIX  Take 1 tablet (40 mg total) by mouth daily. For BP & Fluid     gabapentin 300 MG capsule  Commonly known as:  NEURONTIN  Take 1 capsule (300 mg total) by mouth 3 (three) times daily. 2 am 1 pm     metFORMIN 1000 MG tablet  Commonly known as:  GLUCOPHAGE  Take 1 tablet (1,000 mg total) by mouth 2 (two) times daily with a meal.     minoxidil 10 MG tablet  Commonly known as:  LONITEN  Take 1 tablet (10 mg total) by mouth daily.     naproxen 500 MG tablet  Commonly known as:  NAPROSYN  Take 1 tablet (500 mg total) by mouth as needed.     omeprazole 20 MG capsule  Commonly known as:  PRILOSEC  Take 1 capsule (20 mg total) by mouth daily.     potassium chloride SA 20 MEQ tablet  Commonly known as:  K-DUR,KLOR-CON  Take 1 tablet (20 mEq total) by mouth 2 (two) times daily.     pravastatin 40 MG tablet  Commonly known as:  PRAVACHOL  Take 1 tablet (40 mg total) by mouth every evening.     VITAMIN D (CHOLECALCIFEROL) PO  Take 1 tablet by mouth daily.  Vitamins A & D 5000-400 UNITS Caps     Allergies  Allergen Reactions  . Augmentin [Amoxicillin-Pot Clavulanate]   . Lipitor [Atorvastatin]     tremor  . Lopid [Gemfibrozil]   . Penicillins   . Singulair [Montelukast Sodium]    PMHx:   Past Medical History  Diagnosis Date  . DM   . GERD   . HTN (hypertension)     Normal Cath 09/2012  . Obesity   . Hyperlipidemia   . Nephrolithiasis   . Vitamin D deficiency   . Neuropathy due to secondary diabetes mellitus    FHx:    Reviewed / unchanged SHx:    Reviewed / unchanged  Systems Review:  Constitutional: Denies fever, chills, wt changes, headaches, insomnia, fatigue, night sweats, change in appetite. Eyes: Denies redness, blurred vision, diplopia, discharge, itchy, watery eyes.  ENT: Denies discharge, congestion, post nasal drip, epistaxis, sore throat, earache, hearing loss, dental pain, tinnitus, vertigo, sinus pain, snoring.  CV: Denies chest  pain, palpitations, irregular heartbeat, syncope, dyspnea, diaphoresis, orthopnea, PND, claudication or edema. Respiratory: denies cough, dyspnea, DOE, pleurisy, hoarseness, laryngitis, wheezing.  Gastrointestinal: Denies dysphagia, odynophagia, heartburn, reflux, water brash, abdominal pain or cramps, nausea, vomiting, bloating, diarrhea, constipation, hematemesis, melena, hematochezia  or hemorrhoids. Genitourinary: Denies dysuria, frequency, urgency, nocturia, hesitancy, discharge, hematuria or flank pain. Musculoskeletal: Denies arthralgias, myalgias, stiffness, jt. swelling, pain, limping or strain/sprain.  Skin: Denies pruritus, rash, hives, warts, acne, eczema or change in skin lesion(s). Neuro: No weakness, tremor, incoordination, spasms or pain.Occas numb paresthesias of feet. Psychiatric: Denies confusion, memory loss or sensory loss. Endo: Denies change in weight, skin or hair change.  Heme/Lymph: No excessive bleeding, bruising or enlarged lymph nodes.  Exam:  BP 120/72  Pulse 72  Temp(Src) 98.4 F (36.9 C) (Temporal)  Resp 16  Ht 5\' 8"  (1.727 m)  Wt 226 lb 6.4 oz (102.694 kg)  BMI 34.43 kg/m2  Appears well nourished and in no distress. Eyes: PERRLA, EOMs, conjunctiva no swelling or erythema. Sinuses: No frontal/maxillary tenderness ENT/Mouth: EAC's clear, TM's nl w/o erythema, bulging. Nares clear w/o erythema, swelling, exudates. Oropharynx clear without erythema or exudates. Oral hygiene is good. Tongue normal, non obstructing. Hearing intact.  Neck: Supple. Thyroid nl. Car 2+/2+ without bruits, nodes or JVD. Chest: Respirations nl with BS clear & equal w/o rales, rhonchi, wheezing or stridor.  Cor: Heart sounds normal w/ regular rate and rhythm without sig. murmurs, gallops, clicks, or rubs. Peripheral pulses normal and equal  without edema.  Abdomen: Soft & bowel sounds normal. Non-tender w/o guarding, rebound, hernias, masses, or organomegaly.  Lymphatics:  Unremarkable.  Musculoskeletal: Full ROM all peripheral extremities, joint stability, 5/5 strength, and normal gait.  Skin: Warm, dry without exposed rashes, lesions or ecchymosis apparent.  Neuro: Cranial nerves intact, reflexes equal bilaterally. Sensory-motor testing grossly intact. Tendon reflexes grossly intact.  Pysch: Alert & oriented x 3.  Insight and judgement nl & appropriate. No ideations.   Assessment and Plan:  1. Hypertension - Continue monitor blood pressure at home. Continue diet/meds same.  2. Hyperlipidemia - Continue diet/meds, exercise,& lifestyle modifications. Continue monitor periodic cholesterol/liver & renal functions   3. T2_NIDDM -  Discussed stricter diet and more frequent CBG monitoring.  4. Vitamin D Deficiency - Continue supplementation.  Recommended regular exercise, BP monitoring, weight control, and discussed med and SE's. Recommended labs to assess and monitor clinical status. Further disposition pending results of labs.

## 2014-03-11 NOTE — Patient Instructions (Signed)

## 2014-03-12 ENCOUNTER — Encounter: Payer: Self-pay | Admitting: Internal Medicine

## 2014-03-12 ENCOUNTER — Ambulatory Visit (INDEPENDENT_AMBULATORY_CARE_PROVIDER_SITE_OTHER): Payer: Commercial Managed Care - HMO | Admitting: Internal Medicine

## 2014-03-12 VITALS — BP 120/72 | HR 72 | Temp 98.4°F | Resp 16 | Ht 68.0 in | Wt 226.4 lb

## 2014-03-12 DIAGNOSIS — Z79899 Other long term (current) drug therapy: Secondary | ICD-10-CM

## 2014-03-12 DIAGNOSIS — E785 Hyperlipidemia, unspecified: Secondary | ICD-10-CM

## 2014-03-12 DIAGNOSIS — I10 Essential (primary) hypertension: Secondary | ICD-10-CM

## 2014-03-12 DIAGNOSIS — E559 Vitamin D deficiency, unspecified: Secondary | ICD-10-CM

## 2014-03-12 DIAGNOSIS — E1129 Type 2 diabetes mellitus with other diabetic kidney complication: Secondary | ICD-10-CM

## 2014-03-12 LAB — CBC WITH DIFFERENTIAL/PLATELET
BASOS ABS: 0 10*3/uL (ref 0.0–0.1)
BASOS PCT: 0 % (ref 0–1)
Eosinophils Absolute: 0.1 10*3/uL (ref 0.0–0.7)
Eosinophils Relative: 2 % (ref 0–5)
HCT: 36.6 % — ABNORMAL LOW (ref 39.0–52.0)
Hemoglobin: 12.3 g/dL — ABNORMAL LOW (ref 13.0–17.0)
Lymphocytes Relative: 30 % (ref 12–46)
Lymphs Abs: 1.7 10*3/uL (ref 0.7–4.0)
MCH: 28.1 pg (ref 26.0–34.0)
MCHC: 33.6 g/dL (ref 30.0–36.0)
MCV: 83.6 fL (ref 78.0–100.0)
Monocytes Absolute: 0.5 10*3/uL (ref 0.1–1.0)
Monocytes Relative: 8 % (ref 3–12)
NEUTROS ABS: 3.4 10*3/uL (ref 1.7–7.7)
NEUTROS PCT: 60 % (ref 43–77)
PLATELETS: 238 10*3/uL (ref 150–400)
RBC: 4.38 MIL/uL (ref 4.22–5.81)
RDW: 16.5 % — AB (ref 11.5–15.5)
WBC: 5.7 10*3/uL (ref 4.0–10.5)

## 2014-03-12 LAB — HEMOGLOBIN A1C
Hgb A1c MFr Bld: 7.5 % — ABNORMAL HIGH (ref <5.7)
MEAN PLASMA GLUCOSE: 169 mg/dL — AB (ref <117)

## 2014-03-13 LAB — BASIC METABOLIC PANEL WITH GFR
BUN: 17 mg/dL (ref 6–23)
CALCIUM: 9.2 mg/dL (ref 8.4–10.5)
CO2: 30 mEq/L (ref 19–32)
Chloride: 102 mEq/L (ref 96–112)
Creat: 0.9 mg/dL (ref 0.50–1.35)
GFR, EST NON AFRICAN AMERICAN: 88 mL/min
Glucose, Bld: 252 mg/dL — ABNORMAL HIGH (ref 70–99)
Potassium: 4.2 mEq/L (ref 3.5–5.3)
SODIUM: 141 meq/L (ref 135–145)

## 2014-03-13 LAB — LIPID PANEL
CHOLESTEROL: 171 mg/dL (ref 0–200)
HDL: 35 mg/dL — ABNORMAL LOW (ref >39)
LDL Cholesterol: 78 mg/dL (ref 0–99)
TRIGLYCERIDES: 292 mg/dL — AB (ref <150)
Total CHOL/HDL Ratio: 4.9 Ratio
VLDL: 58 mg/dL — AB (ref 0–40)

## 2014-03-13 LAB — INSULIN, FASTING: INSULIN FASTING, SERUM: 39.7 u[IU]/mL — AB (ref 2.0–19.6)

## 2014-03-13 LAB — HEPATIC FUNCTION PANEL
ALT: 12 U/L (ref 0–53)
AST: 14 U/L (ref 0–37)
Albumin: 3.9 g/dL (ref 3.5–5.2)
Alkaline Phosphatase: 42 U/L (ref 39–117)
BILIRUBIN DIRECT: 0.1 mg/dL (ref 0.0–0.3)
BILIRUBIN INDIRECT: 0.3 mg/dL (ref 0.2–1.2)
TOTAL PROTEIN: 6.2 g/dL (ref 6.0–8.3)
Total Bilirubin: 0.4 mg/dL (ref 0.2–1.2)

## 2014-03-13 LAB — TSH: TSH: 1.222 u[IU]/mL (ref 0.350–4.500)

## 2014-03-13 LAB — VITAMIN D 25 HYDROXY (VIT D DEFICIENCY, FRACTURES): VIT D 25 HYDROXY: 72 ng/mL (ref 30–89)

## 2014-03-13 LAB — MAGNESIUM: MAGNESIUM: 1.7 mg/dL (ref 1.5–2.5)

## 2014-05-11 ENCOUNTER — Ambulatory Visit: Payer: Self-pay

## 2014-05-21 ENCOUNTER — Encounter: Payer: Self-pay | Admitting: Internal Medicine

## 2014-06-22 ENCOUNTER — Ambulatory Visit (INDEPENDENT_AMBULATORY_CARE_PROVIDER_SITE_OTHER): Payer: Commercial Managed Care - HMO | Admitting: Internal Medicine

## 2014-06-22 VITALS — BP 142/88 | HR 60 | Temp 98.1°F | Resp 16 | Ht 68.5 in | Wt 226.0 lb

## 2014-06-22 DIAGNOSIS — E119 Type 2 diabetes mellitus without complications: Secondary | ICD-10-CM | POA: Insufficient documentation

## 2014-06-22 DIAGNOSIS — E669 Obesity, unspecified: Secondary | ICD-10-CM

## 2014-06-22 DIAGNOSIS — M1000 Idiopathic gout, unspecified site: Secondary | ICD-10-CM

## 2014-06-22 DIAGNOSIS — R6889 Other general symptoms and signs: Secondary | ICD-10-CM

## 2014-06-22 DIAGNOSIS — Z125 Encounter for screening for malignant neoplasm of prostate: Secondary | ICD-10-CM

## 2014-06-22 DIAGNOSIS — Z1331 Encounter for screening for depression: Secondary | ICD-10-CM

## 2014-06-22 DIAGNOSIS — Z9181 History of falling: Secondary | ICD-10-CM

## 2014-06-22 DIAGNOSIS — E785 Hyperlipidemia, unspecified: Secondary | ICD-10-CM

## 2014-06-22 DIAGNOSIS — Z23 Encounter for immunization: Secondary | ICD-10-CM

## 2014-06-22 DIAGNOSIS — K219 Gastro-esophageal reflux disease without esophagitis: Secondary | ICD-10-CM

## 2014-06-22 DIAGNOSIS — Z79899 Other long term (current) drug therapy: Secondary | ICD-10-CM

## 2014-06-22 DIAGNOSIS — Z0001 Encounter for general adult medical examination with abnormal findings: Secondary | ICD-10-CM

## 2014-06-22 DIAGNOSIS — Z1212 Encounter for screening for malignant neoplasm of rectum: Secondary | ICD-10-CM

## 2014-06-22 DIAGNOSIS — I10 Essential (primary) hypertension: Secondary | ICD-10-CM

## 2014-06-22 DIAGNOSIS — E559 Vitamin D deficiency, unspecified: Secondary | ICD-10-CM

## 2014-06-22 LAB — CBC WITH DIFFERENTIAL/PLATELET
BASOS ABS: 0 10*3/uL (ref 0.0–0.1)
Basophils Relative: 0 % (ref 0–1)
Eosinophils Absolute: 0.1 10*3/uL (ref 0.0–0.7)
Eosinophils Relative: 2 % (ref 0–5)
HEMATOCRIT: 37.5 % — AB (ref 39.0–52.0)
Hemoglobin: 11.8 g/dL — ABNORMAL LOW (ref 13.0–17.0)
Lymphocytes Relative: 34 % (ref 12–46)
Lymphs Abs: 1.9 10*3/uL (ref 0.7–4.0)
MCH: 24.2 pg — ABNORMAL LOW (ref 26.0–34.0)
MCHC: 31.5 g/dL (ref 30.0–36.0)
MCV: 77 fL — ABNORMAL LOW (ref 78.0–100.0)
MPV: 9.1 fL — ABNORMAL LOW (ref 9.4–12.4)
Monocytes Absolute: 0.6 10*3/uL (ref 0.1–1.0)
Monocytes Relative: 10 % (ref 3–12)
NEUTROS ABS: 3 10*3/uL (ref 1.7–7.7)
NEUTROS PCT: 54 % (ref 43–77)
PLATELETS: 235 10*3/uL (ref 150–400)
RBC: 4.87 MIL/uL (ref 4.22–5.81)
RDW: 16.5 % — ABNORMAL HIGH (ref 11.5–15.5)
WBC: 5.5 10*3/uL (ref 4.0–10.5)

## 2014-06-22 NOTE — Progress Notes (Signed)
Patient ID: Timothy Boyer, male   DOB: 1945-10-29, 68 y.o.   MRN: [id number]  Annual Screening Comprehensive Examination  This very nice 68 y.o.MWM presents for complete physical.  Patient has been followed for HTN, T2_NIDDM  Prediabetes, Hyperlipidemia, and Vitamin D Deficiency.   HTN predates since 40. Patient's BP has been controlled at home.Today's BP was 142/88 mmHg. Patient denies any cardiac symptoms as chest pain, palpitations, shortness of breath, dizziness or ankle swelling.   Patient's hyperlipidemia is controlled with diet and medications. Patient denies myalgias or other medication SE's. Last lipids were at goal  -  Total Chol 171; HDL 35*; LDL 78; and with elevated Trig 292 on 03/12/2014.   Patient has T2_NIDDM since 2007 and patient denies reactive hypoglycemic symptoms, visual blurring, diabetic polys or paresthesias. Last A1c was 7.5% on 03/12/2014.   Finally, patient has history of Vitamin D Deficiency of    and last vitamin D was  72 on  03/12/2014.  Medication Sig  . ALBUTEROL IN Inhale into the lungs as needed.  Marland Kitchen aspirin 81 MG tablet Take 81 mg by mouth daily.  . benazepril-hydrochlorthiazide (LOTENSIN HCT) 20-12.5 MG per tablet Take 2 tablets by mouth daily. Take 2 tablets daily for BP  . bisoprolol-hydrochlorothiazide (ZIAC) 10-6.25 MG per tablet Take 1 tablet by mouth daily.  . citalopram (CELEXA) 40 MG tablet Take 1 tablet (40 mg total) by mouth daily.  . cloNIDine (CATAPRES) 0.2 MG tablet Take 1 tablet (0.2 mg total) by mouth daily.  Marland Kitchen doxazosin (CARDURA) 4 MG tablet Take 1 tablet at bedtime for BP and Prostate  . ezetimibe (ZETIA) 10 MG tablet Take 1 tablet (10 mg total) by mouth daily. For Cholesterol  . finasteride (PROSCAR) 5 MG tablet 1/2-1 tab po qd  . furosemide (LASIX) 40 MG tablet Take 1 tablet (40 mg total) by mouth daily. For BP & Fluid  . gabapentin (NEURONTIN) 300 MG capsule Take 1 capsule (300 mg total) by mouth 3 (three) times daily. 2 am 1 pm  .  metFORMIN (GLUCOPHAGE) 1000 MG tablet Take 1 tablet (1,000 mg total) by mouth 2 (two) times daily with a meal.  . minoxidil (LONITEN) 10 MG tablet Take 1 tablet (10 mg total) by mouth daily.  . naproxen (NAPROSYN) 500 MG tablet Take 1 tablet (500 mg total) by mouth as needed.  Marland Kitchen omeprazole (PRILOSEC) 20 MG capsule Take 1 capsule (20 mg total) by mouth daily.  . potassium chloride SA (K-DUR,KLOR-CON) 20 MEQ tablet Take 1 tablet (20 mEq total) by mouth 2 (two) times daily.  . pravastatin (PRAVACHOL) 40 MG tablet Take 1 tablet (40 mg total) by mouth every evening.  Marland Kitchen VITAMIN D, CHOLECALCIFEROL, PO Take 1 tablet by mouth daily.  . Vitamins A & D 5000-400 UNITS CAPS    Allergies  Allergen Reactions  . Augmentin [Amoxicillin-Pot Clavulanate]   . Lipitor [Atorvastatin]     tremor  . Lopid [Gemfibrozil]   . Penicillins   . Singulair [Montelukast Sodium]    Past Medical History  Diagnosis Date  . DM   . GERD   . HTN (hypertension)     Normal Cath 09/2012  . Obesity   . Hyperlipidemia   . Nephrolithiasis   . Vitamin D deficiency   . Neuropathy due to secondary diabetes mellitus    Health Maintenance  Topic Date Due  . FOOT EXAM  12/07/1956  . TETANUS/TDAP  07/14/2011  . INFLUENZA VACCINE  02/10/2014  . URINE MICROALBUMIN  05/18/2014  .  HEMOGLOBIN A1C  09/10/2014  . OPHTHALMOLOGY EXAM  06/01/2015  . COLONOSCOPY  07/13/2021  . PNEUMOCOCCAL POLYSACCHARIDE VACCINE AGE 34 AND OVER  Completed  . ZOSTAVAX  Completed   Immunization History  Administered Date(s) Administered  . Influenza, High Dose Seasonal PF 05/08/2013  . Pneumococcal Polysaccharide-23 05/08/2013  . Td 07/13/2001  . Zoster 06/13/2013   Past Surgical History  Procedure Laterality Date  . Back surgery    . Carpel tunnal    . Elbow surgery     Family History  Problem Relation Age of Onset  . Heart disease Brother     Valve surgery  . Heart disease Brother   . COPD Mother   . Heart disease Mother   . Heart  disease Father   . Cancer Father     pancreas   History   Social History  . Marital Status: Married    Spouse Name: N/A    Number of Children: 1  . Years of Education: N/A   Occupational History    Dealer   Social History Main Topics  . Smoking status: Never Smoker   . Smokeless tobacco: Not on file  . Alcohol Use: No     Comment: Rare  . Drug Use: No  . Sexual Activity: Not on file    ROS Constitutional: Denies fever, chills, weight loss/gain, headaches, insomnia, fatigue, night sweats or change in appetite. Eyes: Denies redness, blurred vision, diplopia, discharge, itchy or watery eyes.  ENT: Denies discharge, congestion, post nasal drip, epistaxis, sore throat, earache, hearing loss, dental pain, Tinnitus, Vertigo, Sinus pain or snoring.  Cardio: Denies chest pain, palpitations, irregular heartbeat, syncope, dyspnea, diaphoresis, orthopnea, PND, claudication or edema Respiratory: denies cough, dyspnea, DOE, pleurisy, hoarseness, laryngitis or wheezing.  Gastrointestinal: Denies dysphagia, heartburn, reflux, water brash, pain, cramps, nausea, vomiting, bloating, diarrhea, constipation, hematemesis, melena, hematochezia, jaundice or hemorrhoids Genitourinary: Denies dysuria, frequency, urgency, nocturia, hesitancy, discharge, hematuria or flank pain Musculoskeletal: Denies arthralgia, myalgia, stiffness, Jt. Swelling, pain, limp or strain/sprain. Denies Falls. Skin: Denies puritis, rash, hives, warts, acne, eczema or change in skin lesion Neuro: No weakness, tremor, incoordination, spasms, paresthesia or pain Psychiatric: Denies confusion, memory loss or sensory loss. Denies Depression. Endocrine: Denies change in weight, skin, hair change, nocturia, and paresthesia, diabetic polys, visual blurring or hyper / hypo glycemic episodes.  Heme/Lymph: No excessive bleeding, bruising or enlarged lymph nodes.  Physical Exam  BP 142/88   Pulse 60  Temp 98.1 F   Resp 16  Ht 5'  8.5"   Wt 226 lb          BMI 33.86    General Appearance: Well nourished, in no apparent distress. Eyes: PERRLA, EOMs, conjunctiva no swelling or erythema, normal fundi and vessels. Sinuses: No frontal/maxillary tenderness ENT/Mouth: EACs patent / TMs  nl. Nares clear without erythema, swelling, mucoid exudates. Oral hygiene is good. No erythema, swelling, or exudate. Tongue normal, non-obstructing. Tonsils not swollen or erythematous. Hearing normal.  Neck: Supple, thyroid normal. No bruits, nodes or JVD. Respiratory: Respiratory effort normal.  BS equal and clear bilateral without rales, rhonci, wheezing or stridor. Cardio: Heart sounds are normal with regular rate and rhythm and no murmurs, rubs or gallops. Peripheral pulses are normal and equal bilaterally without edema. No aortic or femoral bruits. Chest: symmetric with normal excursions and percussion.  Abdomen: Flat, soft, with bowl sounds. Nontender, no guarding, rebound, hernias, masses, or organomegaly.  Lymphatics: Non tender without lymphadenopathy.  Genitourinary: No hernias.Testes nl. DRE -  prostate nl for age - smooth & firm w/o nodules. Musculoskeletal: Full ROM all peripheral extremities, joint stability, 5/5 strength, and normal gait. Skin: Warm and dry without rashes, lesions, cyanosis, clubbing or  ecchymosis.  Neuro: Cranial nerves intact, reflexes equal bilaterally. Normal muscle tone, no cerebellar symptoms. Sensation intact.  Pysch: Awake and oriented X 3with normal affect, insight and judgment appropriate.   Assessment and Plan  1. Encounter for general adult medical examination with abnormal findings   2. Essential hypertension  - Microalbumin / creatinine urine ratio - EKG 12-Lead - [place], RETROPERITNL ABD,  LTD - TSH  3. Hyperlipidemia  - Lipid panel  4. Type 2 diabetes mellitus without complication  - Hemoglobin A1c - Insulin, fasting  5. Vitamin D deficiency  - Vit D  25 hydroxy (rtn osteoporosis  monitoring)  6. Obesity   7. Gastroesophageal reflux disease, esophagitis presence not specified   8. Medication management  - Urine Microscopic - CBC with Differential - BASIC METABOLIC PANEL WITH GFR - Hepatic function panel - Magnesium  9. Screening for rectal cancer  - POC Hemoccult Bld/Stl (3-Cd Home Screen); Future  10. Prostate cancer screening  - PSA  11. At low risk for fall   12. Depression screen   Continue prudent diet as discussed, weight control, BP monitoring, regular exercise, and medications as discussed.  Discussed med effects and SE's. Routine screening labs and tests as requested with regular follow-up as recommended.

## 2014-06-22 NOTE — Patient Instructions (Signed)

## 2014-06-23 LAB — LIPID PANEL
Cholesterol: 157 mg/dL (ref 0–200)
HDL: 39 mg/dL — ABNORMAL LOW (ref >39)
LDL CALC: 97 mg/dL (ref 0–99)
Total CHOL/HDL Ratio: 4 Ratio
Triglycerides: 107 mg/dL (ref <150)
VLDL: 21 mg/dL (ref 0–40)

## 2014-06-23 LAB — URINALYSIS, MICROSCOPIC ONLY
Bacteria, UA: NONE SEEN
CASTS: NONE SEEN
Crystals: NONE SEEN
Squamous Epithelial / LPF: NONE SEEN

## 2014-06-23 LAB — URIC ACID: Uric Acid, Serum: 4.4 mg/dL (ref 4.0–7.8)

## 2014-06-23 LAB — HEPATIC FUNCTION PANEL
ALT: 12 U/L (ref 0–53)
AST: 15 U/L (ref 0–37)
Albumin: 4.4 g/dL (ref 3.5–5.2)
Alkaline Phosphatase: 55 U/L (ref 39–117)
BILIRUBIN INDIRECT: 0.4 mg/dL (ref 0.2–1.2)
Bilirubin, Direct: 0.1 mg/dL (ref 0.0–0.3)
Total Bilirubin: 0.5 mg/dL (ref 0.2–1.2)
Total Protein: 7 g/dL (ref 6.0–8.3)

## 2014-06-23 LAB — HEMOGLOBIN A1C
Hgb A1c MFr Bld: 7 % — ABNORMAL HIGH (ref <5.7)
MEAN PLASMA GLUCOSE: 154 mg/dL — AB (ref <117)

## 2014-06-23 LAB — BASIC METABOLIC PANEL WITH GFR
BUN: 18 mg/dL (ref 6–23)
CO2: 32 mEq/L (ref 19–32)
Calcium: 10 mg/dL (ref 8.4–10.5)
Chloride: 103 mEq/L (ref 96–112)
Creat: 0.9 mg/dL (ref 0.50–1.35)
GFR, EST NON AFRICAN AMERICAN: 88 mL/min
GFR, Est African American: >89 mL/min
Glucose, Bld: 171 mg/dL — ABNORMAL HIGH (ref 70–99)
POTASSIUM: 4 meq/L (ref 3.5–5.3)
Sodium: 143 mEq/L (ref 135–145)

## 2014-06-23 LAB — PSA: PSA: 0.14 ng/mL (ref <=4.00)

## 2014-06-23 LAB — MICROALBUMIN / CREATININE URINE RATIO
Creatinine, Urine: 21.7 mg/dL
Microalb Creat Ratio: 110.6 mg/g — ABNORMAL HIGH (ref 0.0–30.0)
Microalb, Ur: 2.4 mg/dL — ABNORMAL HIGH (ref <2.0)

## 2014-06-23 LAB — INSULIN, FASTING: Insulin fasting, serum: 8.1 u[IU]/mL (ref 2.0–19.6)

## 2014-06-23 LAB — MAGNESIUM: Magnesium: 1.9 mg/dL (ref 1.5–2.5)

## 2014-06-23 LAB — TSH: TSH: 1.604 u[IU]/mL (ref 0.350–4.500)

## 2014-06-23 LAB — VITAMIN D 25 HYDROXY (VIT D DEFICIENCY, FRACTURES): Vit D, 25-Hydroxy: 64 ng/mL (ref 30–100)

## 2014-06-24 ENCOUNTER — Encounter: Payer: Self-pay | Admitting: Internal Medicine

## 2014-06-24 ENCOUNTER — Other Ambulatory Visit: Payer: Self-pay | Admitting: Internal Medicine

## 2014-06-24 DIAGNOSIS — D509 Iron deficiency anemia, unspecified: Secondary | ICD-10-CM

## 2014-07-07 ENCOUNTER — Encounter: Payer: Self-pay | Admitting: *Deleted

## 2014-07-09 ENCOUNTER — Other Ambulatory Visit: Payer: Self-pay | Admitting: Internal Medicine

## 2014-07-09 ENCOUNTER — Encounter: Payer: Self-pay | Admitting: Internal Medicine

## 2014-07-09 DIAGNOSIS — N400 Enlarged prostate without lower urinary tract symptoms: Secondary | ICD-10-CM

## 2014-07-09 DIAGNOSIS — J4520 Mild intermittent asthma, uncomplicated: Secondary | ICD-10-CM

## 2014-07-09 MED ORDER — FINASTERIDE 5 MG PO TABS: ORAL_TABLET | ORAL | Status: AC

## 2014-07-09 MED ORDER — ALBUTEROL SULFATE HFA 108 (90 BASE) MCG/ACT IN AERS: 1.0000 | INHALATION_SPRAY | RESPIRATORY_TRACT | Status: DC | PRN

## 2014-07-24 ENCOUNTER — Encounter: Payer: Self-pay | Admitting: Gastroenterology

## 2014-07-24 ENCOUNTER — Encounter: Payer: Self-pay | Admitting: Internal Medicine

## 2014-08-07 ENCOUNTER — Encounter: Payer: Self-pay | Admitting: Internal Medicine

## 2014-08-07 ENCOUNTER — Ambulatory Visit (INDEPENDENT_AMBULATORY_CARE_PROVIDER_SITE_OTHER): Payer: Commercial Managed Care - HMO | Admitting: Physician Assistant

## 2014-08-07 VITALS — BP 146/84 | HR 92 | Temp 98.0°F | Resp 16 | Ht 68.5 in | Wt 232.0 lb

## 2014-08-07 DIAGNOSIS — E785 Hyperlipidemia, unspecified: Secondary | ICD-10-CM

## 2014-08-07 DIAGNOSIS — J45901 Unspecified asthma with (acute) exacerbation: Secondary | ICD-10-CM

## 2014-08-07 DIAGNOSIS — Z23 Encounter for immunization: Secondary | ICD-10-CM

## 2014-08-07 DIAGNOSIS — Z79899 Other long term (current) drug therapy: Secondary | ICD-10-CM

## 2014-08-07 NOTE — Progress Notes (Signed)
HPI  A Caucasian 69 y.o.male presents to the office today due to follow up for medication reconciliation and tetanus vaccine.  Patient states he is also having SOB and wheezing that started last Thursday (6 days ago).  Patient states his sxs get better with Proair inhaler.  Patient also has Advair 100/54mcg inhaler at home, but has not taking it today.  Patient only smoked one cigarette when he was a teenager.  Patient is currently taking Lasix 40mg  daily.  He does report occasional staggering on his feet with feeling off balance.  No pattern to staggering, but states it is better now.  He denies fever, chills, sweats, fatigue, congestion, sinus pressure, ear pain/congestion, sore throat, trouble swallowing, cough, chest tightness, CP, abdominal pain, nausea, vomiting, diarrhea constipation, dizziness, lightheadedness and headaches.  Review of Systems  Constitutional: Negative.  Negative for fever, chills, malaise/fatigue and diaphoresis.  HENT: Negative for congestion, ear pain and sore throat.        Chronic dry mouth  Eyes: Negative.   Respiratory: Positive for shortness of breath and wheezing. Negative for cough and sputum production.   Cardiovascular: Negative.  Negative for chest pain and leg swelling.  Gastrointestinal: Negative.  Negative for nausea, vomiting, abdominal pain, diarrhea and constipation.  Genitourinary: Negative.   Skin: Negative.  Negative for rash.  Neurological: Negative.  Negative for dizziness and headaches.       Staggering little bit and balance, but is better.   Past Medical History-  Past Medical History  Diagnosis Date  . DM   . GERD   . HTN (hypertension)     Normal Cath 09/2012  . Obesity   . Hyperlipidemia   . Nephrolithiasis   . Vitamin D deficiency   . Neuropathy due to secondary diabetes mellitus    Medications-  Current Outpatient Prescriptions on File Prior to Visit  Medication Sig Dispense Refill  . albuterol (PROAIR HFA) 108 (90 BASE)  MCG/ACT inhaler Inhale 1-2 puffs into the lungs every 4 (four) hours as needed for wheezing or shortness of breath. 18 g 99  . ALBUTEROL IN Inhale into the lungs as needed.    Marland Kitchen BAYER LOW DOSE 81 MG EC tablet     . benazepril-hydrochlorthiazide (LOTENSIN HCT) 20-12.5 MG per tablet Take 2 tablets by mouth daily. Take 2 tablets daily for BP 180 tablet 3  . bisoprolol-hydrochlorothiazide (ZIAC) 10-6.25 MG per tablet Take 1 tablet by mouth daily. 90 tablet 3  . citalopram (CELEXA) 40 MG tablet Take 1 tablet (40 mg total) by mouth daily. 90 tablet 3  . cloNIDine (CATAPRES) 0.2 MG tablet Take 1 tablet (0.2 mg total) by mouth daily. 90 tablet 3  . doxazosin (CARDURA) 4 MG tablet Take 1 tablet at bedtime for BP and Prostate 90 tablet 3  . ezetimibe (ZETIA) 10 MG tablet Take 1 tablet (10 mg total) by mouth daily. For Cholesterol 90 tablet 3  . finasteride (PROSCAR) 5 MG tablet 1/2-1 tab po qd 90 tablet 99  . furosemide (LASIX) 40 MG tablet Take 1 tablet (40 mg total) by mouth daily. For BP & Fluid 90 tablet 99  . gabapentin (NEURONTIN) 300 MG capsule Take 1 capsule (300 mg total) by mouth 3 (three) times daily. 2 am 1 pm 270 capsule 3  . ibuprofen (ADVIL,MOTRIN) 200 MG tablet     . Loratadine 10 MG CAPS     . metFORMIN (GLUCOPHAGE) 1000 MG tablet Take 1 tablet (1,000 mg total) by mouth 2 (two) times daily  with a meal. 180 tablet 3  . minoxidil (LONITEN) 10 MG tablet Take 1 tablet (10 mg total) by mouth daily. 90 tablet 3  . naproxen (NAPROSYN) 500 MG tablet Take 1 tablet (500 mg total) by mouth as needed. 90 tablet 3  . NEXIUM 20 MG capsule     . omeprazole (PRILOSEC) 20 MG capsule Take 1 capsule (20 mg total) by mouth daily. 90 capsule 3  . potassium chloride SA (K-DUR,KLOR-CON) 20 MEQ tablet Take 1 tablet (20 mEq total) by mouth 2 (two) times daily. 180 tablet 3  . pravastatin (PRAVACHOL) 40 MG tablet Take 1 tablet (40 mg total) by mouth every evening. 90 tablet 3  . VITAMIN D, CHOLECALCIFEROL, PO Take  1 tablet by mouth daily.    . Vitamins A & D 5000-400 UNITS CAPS      No current facility-administered medications on file prior to visit.   Allergies-  Allergies  Allergen Reactions  . Augmentin [Amoxicillin-Pot Clavulanate]   . Lipitor [Atorvastatin]     tremor  . Lopid [Gemfibrozil]   . Penicillins   . Singulair [Montelukast Sodium]    Physical Exam BP 146/84 mmHg  Pulse 92  Temp(Src) 98 F (36.7 C) (Temporal)  Resp 16  Ht 5' 8.5" (1.74 m)  Wt 232 lb (105.235 kg)  BMI 34.76 kg/m2  SpO2 94% Wt Readings from Last 3 Encounters:  08/07/14 232 lb (105.235 kg)  06/22/14 226 lb (102.513 kg)  03/12/14 226 lb 6.4 oz (102.694 kg)  Vitals Reviewed. General Appearance: Well nourished, in no apparent distress and had pleasant demeanor. Obese. No sickly appearance. Eyes:  PERRLA. EOMI. Conjunctiva is pink without edema, erythema or yellowing. No scleral icterus Sinuses: No Frontal/maxillary tenderness Ears: No erythema, edema or tenderness on both external ear cartilages and ear canals.  TMs are intact bilaterally with normal light reflexes and without erythema, edema or bulging. Nose: Nose is symmetrical and turbinates are pink and not erythematous, edematous or  pale.  No polyps, tenderness or rhinorrhea. Throat: Oral pharynx is pink and moist. No erythema, edema or tenderness in pharynx or posterior pharynx.   Mucosa is intact and without lesions. Tonsils are at +1 station bilaterally and do not have exudate.    Uvula is midline and not swollen. Neck: Supple, no JVD,  LAD, trachea is midline. Full range of motion in neck intact Respiratory: CTAB,  r/r/w or stridor. No increased effort of breathing. Cardio: RRR.   m/r/g.  S1S2nl.   Abdomen: Symmetrical, soft, nontender, and rounded. +BS nl x4. About 1 cm small muscle knot in RUQ just diagonal to umbilicus. Extremities:  C/C/E in upper and lower extremities. Pulses B/L +2  Skin: Warm, dry, intact without rashes, lesions,  ecchymosis, yellowing, cyanosis. Capillary refill equal bilaterally in hands.   Neuro: Alert and oriented X3, cooperative.  Mood and affect appropriate to situation.  CN II-XII grossly intact.   Psych: Insight and Judgment appropriate.   Assessment and Plan 1. Asthma with acute exacerbation, unspecified asthma severity Continue Advair and Proair inhalers as prescribed.    2. Need for vaccine for TD (tetanus-diphtheria) - DT Vaccine greater than 7yo IM  3. Hyperlipidemia Stop Pravastatin and continue Zetia as prescribed.  4. Medication Management -Reviewed medications and which ones patient should be taking.  If SOB or wheezing is not improving, then please call the office and I will order chest x-ray.  Discussed medication effects and SE's.  Pt agreed to treatment plan. If you are not feeling  better in 10-14 days, then please call the office. Please keep your follow up appt on 11/08/14.  Alyjah Lovingood, Stephani Police, PA-C 12:06 PM [facility]

## 2014-08-07 NOTE — Patient Instructions (Addendum)
-Continue Advair and Albuterol as prescribed.  If the shortness or breath and wheezing continues, then please call the office and I will order a chest x-ray.  Please keep your appt on 11/08/14   Asthma Asthma is a recurring condition in which the airways tighten and narrow. Asthma can make it difficult to breathe. It can cause coughing, wheezing, and shortness of breath. Asthma episodes, also called asthma attacks, range from minor to life-threatening. Asthma cannot be cured, but medicines and lifestyle changes can help control it. CAUSES Asthma is believed to be caused by inherited (genetic) and environmental factors, but its exact cause is unknown. Asthma may be triggered by allergens, lung infections, or irritants in the air. Asthma triggers are different for each person. Common triggers include:   Animal dander.  Dust mites.  Cockroaches.  Pollen from trees or grass.  Mold.  Smoke.  Air pollutants such as dust, household cleaners, hair sprays, aerosol sprays, paint fumes, strong chemicals, or strong odors.  Cold air, weather changes, and winds (which increase molds and pollens in the air).  Strong emotional expressions such as crying or laughing hard.  Stress.  Certain medicines (such as aspirin) or types of drugs (such as beta-blockers).  Sulfites in foods and drinks. Foods and drinks that may contain sulfites include dried fruit, potato chips, and sparkling grape juice.  Infections or inflammatory conditions such as the flu, a cold, or an inflammation of the nasal membranes (rhinitis).  Gastroesophageal reflux disease (GERD).  Exercise or strenuous activity. SYMPTOMS Symptoms may occur immediately after asthma is triggered or many hours later. Symptoms include:  Wheezing.  Excessive nighttime or early morning coughing.  Frequent or severe coughing with a common cold.  Chest tightness.  Shortness of breath. DIAGNOSIS  The diagnosis of asthma is made by a  review of your medical history and a physical exam. Tests may also be performed. These may include:  Lung function studies. These tests show how much air you breathe in and out.  Allergy tests.  Imaging tests such as X-rays. TREATMENT  Asthma cannot be cured, but it can usually be controlled. Treatment involves identifying and avoiding your asthma triggers. It also involves medicines. There are 2 classes of medicine used for asthma treatment:   Controller medicines. These prevent asthma symptoms from occurring. They are usually taken every day.  Reliever or rescue medicines. These quickly relieve asthma symptoms. They are used as needed and provide short-term relief. Your health care provider will help you create an asthma action plan. An asthma action plan is a written plan for managing and treating your asthma attacks. It includes a list of your asthma triggers and how they may be avoided. It also includes information on when medicines should be taken and when their dosage should be changed. An action plan may also involve the use of a device called a peak flow meter. A peak flow meter measures how well the lungs are working. It helps you monitor your condition. HOME CARE INSTRUCTIONS   Take medicines only as directed by your health care provider. Speak with your health care provider if you have questions about how or when to take the medicines.  Use a peak flow meter as directed by your health care provider. Record and keep track of readings.  Understand and use the action plan to help minimize or stop an asthma attack without needing to seek medical care.  Control your home environment in the following ways to help prevent asthma attacks:  Do not smoke. Avoid being exposed to secondhand smoke.  Change your heating and air conditioning filter regularly.  Limit your use of fireplaces and wood stoves.  Get rid of pests (such as roaches and mice) and their droppings.  Throw away  plants if you see mold on them.  Clean your floors and dust regularly. Use unscented cleaning products.  Try to have someone else vacuum for you regularly. Stay out of rooms while they are being vacuumed and for a short while afterward. If you vacuum, use a dust mask from a hardware store, a double-layered or microfilter vacuum cleaner bag, or a vacuum cleaner with a HEPA filter.  Replace carpet with wood, tile, or vinyl flooring. Carpet can trap dander and dust.  Use allergy-proof pillows, mattress covers, and box spring covers.  Wash bed sheets and blankets every week in hot water and dry them in a dryer.  Use blankets that are made of polyester or cotton.  Clean bathrooms and kitchens with bleach. If possible, have someone repaint the walls in these rooms with mold-resistant paint. Keep out of the rooms that are being cleaned and painted.  Wash hands frequently. SEEK MEDICAL CARE IF:   You have wheezing, shortness of breath, or a cough even if taking medicine to prevent attacks.  The colored mucus you cough up (sputum) is thicker than usual.  Your sputum changes from clear or white to yellow, green, gray, or bloody.  You have any problems that may be related to the medicines you are taking (such as a rash, itching, swelling, or trouble breathing).  You are using a reliever medicine more than 2-3 times per week.  Your peak flow is still at 50-79% of your personal best after following your action plan for 1 hour.  You have a fever. SEEK IMMEDIATE MEDICAL CARE IF:   You seem to be getting worse and are unresponsive to treatment during an asthma attack.  You are short of breath even at rest.  You get short of breath when doing very little physical activity.  You have difficulty eating, drinking, or talking due to asthma symptoms.  You develop chest pain.  You develop a fast heartbeat.  You have a bluish color to your lips or fingernails.  You are light-headed, dizzy, or  faint.  Your peak flow is less than 50% of your personal best. MAKE SURE YOU:   Understand these instructions.  Will watch your condition.  Will get help right away if you are not doing well or get worse. Document Released: 06/29/2005 Document Revised: 11/13/2013 Document Reviewed: 01/26/2013 [facility] Patient Information 2015 [place], [place]. This information is not intended to replace advice given to you by your health care provider. Make sure you discuss any questions you have with your health care provider.

## 2014-09-12 ENCOUNTER — Ambulatory Visit (AMBULATORY_SURGERY_CENTER): Payer: Self-pay

## 2014-09-12 VITALS — Ht 67.0 in | Wt 228.0 lb

## 2014-09-12 DIAGNOSIS — Z860100 Personal history of colon polyps, unspecified: Secondary | ICD-10-CM

## 2014-09-12 MED ORDER — SUPREP BOWEL PREP KIT 17.5-3.13-1.6 GM/177ML PO SOLN: 1.0000 | Freq: Once | ORAL | Status: DC

## 2014-09-12 NOTE — Progress Notes (Signed)
No allergies to eggs or soy No diet/weight loss meds No home oxygen No past problems with anesthesia  No email 

## 2014-09-16 ENCOUNTER — Encounter: Payer: Self-pay | Admitting: Internal Medicine

## 2014-09-16 ENCOUNTER — Other Ambulatory Visit: Payer: Self-pay | Admitting: Internal Medicine

## 2014-09-16 DIAGNOSIS — J4520 Mild intermittent asthma, uncomplicated: Secondary | ICD-10-CM

## 2014-09-16 DIAGNOSIS — G4730 Sleep apnea, unspecified: Secondary | ICD-10-CM

## 2014-09-17 MED ORDER — METFORMIN HCL 1000 MG PO TABS: 1000.0000 mg | ORAL_TABLET | Freq: Two times a day (BID) | ORAL | Status: AC

## 2014-09-17 MED ORDER — ALBUTEROL SULFATE HFA 108 (90 BASE) MCG/ACT IN AERS: 1.0000 | INHALATION_SPRAY | RESPIRATORY_TRACT | Status: AC | PRN

## 2014-09-18 NOTE — Addendum Note (Signed)
Addended by: Vicie Mutters R on: 09/18/2014 01:23 PM   Modules accepted: Orders

## 2014-09-20 ENCOUNTER — Encounter: Payer: Self-pay | Admitting: Internal Medicine

## 2014-09-26 ENCOUNTER — Encounter: Payer: Commercial Managed Care - HMO | Admitting: Gastroenterology

## 2014-10-22 ENCOUNTER — Other Ambulatory Visit: Payer: Self-pay | Admitting: Internal Medicine

## 2014-10-22 ENCOUNTER — Encounter: Payer: Self-pay | Admitting: Internal Medicine

## 2014-10-22 DIAGNOSIS — M25519 Pain in unspecified shoulder: Secondary | ICD-10-CM

## 2014-10-23 DIAGNOSIS — K219 Gastro-esophageal reflux disease without esophagitis: Secondary | ICD-10-CM | POA: Diagnosis not present

## 2014-10-23 DIAGNOSIS — G4733 Obstructive sleep apnea (adult) (pediatric): Secondary | ICD-10-CM | POA: Diagnosis not present

## 2014-10-23 DIAGNOSIS — E119 Type 2 diabetes mellitus without complications: Secondary | ICD-10-CM | POA: Diagnosis not present

## 2014-10-23 DIAGNOSIS — I10 Essential (primary) hypertension: Secondary | ICD-10-CM | POA: Diagnosis not present

## 2014-11-07 DIAGNOSIS — G4733 Obstructive sleep apnea (adult) (pediatric): Secondary | ICD-10-CM | POA: Diagnosis not present

## 2014-11-07 DIAGNOSIS — E119 Type 2 diabetes mellitus without complications: Secondary | ICD-10-CM | POA: Diagnosis not present

## 2014-11-07 DIAGNOSIS — K219 Gastro-esophageal reflux disease without esophagitis: Secondary | ICD-10-CM | POA: Diagnosis not present

## 2014-11-07 DIAGNOSIS — I10 Essential (primary) hypertension: Secondary | ICD-10-CM | POA: Diagnosis not present

## 2014-11-08 ENCOUNTER — Ambulatory Visit: Payer: Self-pay | Admitting: Internal Medicine

## 2014-11-14 DIAGNOSIS — M65352 Trigger finger, left little finger: Secondary | ICD-10-CM | POA: Diagnosis not present

## 2014-11-14 DIAGNOSIS — M7541 Impingement syndrome of right shoulder: Secondary | ICD-10-CM | POA: Diagnosis not present

## 2014-11-14 DIAGNOSIS — M7542 Impingement syndrome of left shoulder: Secondary | ICD-10-CM | POA: Diagnosis not present

## 2014-11-19 ENCOUNTER — Encounter: Payer: Self-pay | Admitting: Internal Medicine

## 2014-11-25 DIAGNOSIS — F329 Major depressive disorder, single episode, unspecified: Secondary | ICD-10-CM | POA: Insufficient documentation

## 2014-11-25 DIAGNOSIS — F32A Depression, unspecified: Secondary | ICD-10-CM | POA: Insufficient documentation

## 2014-11-25 NOTE — Progress Notes (Signed)
Patient ID: Timothy Boyer, male   DOB: 09/02/1945, 69 y.o.   MRN: [id number]  MEDICARE ANNUAL WELLNESS VISIT AND OV  Assessment:   1. Essential hypertension  - TSH  2. Hyperlipidemia  - Lipid panel  3. Type 2 diabetes mellitus with Peripheral Neuropathy  - Hemoglobin A1c - Insulin, random - HM DIABETES FOOT EXAM - LOW EXTREMITY NEUR EXAM DOCUM  4. Vitamin D deficiency  - Vit D  25 hydroxy   5. Gastroesophageal reflux disease   6. Obesity   7. Depression screen  - Screen Negative  8. At low risk for fall   9. Depression, controlled  - Screen Negative  10. Routine general medical examination at a health care facility   11. Medication management  - CBC with Differential/Platelet - BASIC METABOLIC PANEL WITH GFR - Hepatic function panel - Magnesium  Plan:   During the course of the visit the patient was educated and counseled about appropriate screening and preventive services including:    Pneumococcal vaccine   Influenza vaccine  Td vaccine  Screening electrocardiogram  Bone densitometry screening  Colorectal cancer screening  Diabetes screening  Glaucoma screening  Nutrition counseling   Advanced directives: requested  Screening recommendations, referrals: Vaccinations: Immunization History  Administered Date(s) Administered  . DT 08/07/2014  . Influenza, High Dose Seasonal PF 05/08/2013  . Pneumococcal Conjugate-13 06/22/2014  . Pneumococcal Polysaccharide-23 05/08/2013  . Td 07/13/2001  . Zoster 06/13/2013  Hep B vaccine not indicated  Nutrition assessed and recommended  Colonoscopy 2013 Recommended yearly ophthalmology/optometry visit for glaucoma screening and checkup Recommended yearly dental visit for hygiene and checkup Advanced directives - yes  Conditions/risks identified: BMI: Discussed weight loss, diet, and increase physical activity.  Increase physical activity: AHA recommends 150 minutes of physical activity  a week.  Medications reviewed Diabetes is not at goal, ACE/ARB therapy: Yes. Urinary Incontinence is not an issue: discussed non pharmacology and pharmacology options.  Fall risk: low- discussed PT, home fall assessment, medications.   Subjective:    Timothy Boyer  presents for [employer] Visit and OV.  Date of last medicare wellness visit was 2sd Feb 2015. This very nice 69 y.o. MWM also presents also for 3 month follow up with Hypertension, Hyperlipidemia, T2_NIDDM and Vitamin D Deficiency. Other problems include GERD which is controlled with diet & meds.    Patient is treated for HTN since 1985 & BP has been controlled at home. Today's BP: 136/62 mmHg. Patient has had no complaints of any cardiac type chest pain, palpitations, dyspnea/orthopnea/PND, dizziness, claudication, or dependent edema.   Hyperlipidemia is controlled with diet & meds. Patient denies myalgias or other med SE's. Last Lipids were Total Chol 157; HDL 39; LDL 97; Trig 107 on 06/22/2014.   Also, the patient has history of Morbid obesity (BMI 33.41) and consequent T2_NIDDM since 2007 with neuropathy and has had no symptoms of reactive hypoglycemia, diabetic polys or visual blurring. He does report burning paresthesias of the feet improved on Gabapentin.  Last A1c was 7.0% on 06/22/2014.    Further, the patient also has history of Vitamin D Deficiency and supplements vitamin D without any suspected side-effects. Last vitamin D was 64 on 06/22/2014.   Names of Other Physician/Practitioners you currently use: 1. [facility] here for primary care 2. Dr Einar Gip, OD, eye doctor, last visit 05/31/2014 3. Dr Vale Haven, DDS, dentist, last visit - dental cleaning every 6 months  Patient Care Team: Unk Pinto,  MD as PCP - General (Internal Medicine) Webb Laws, OD as Referring Physician (Optometry) Lelon Perla, MD as Consulting Physician (Cardiology) Inda Castle, MD as Consulting Physician (Gastroenterology) Sherren Mocha, MD as Consulting Physician (Cardiology)  Medication Review: Medication Sig  . albuterol  HFA  inhaler Inhale 1-2 puffs every 4  hours as needed  . BAYER LOW DOSE 81 MG 1 tab DAILY  . benazepril-hctz 20-12.5 MG Take 2 tablets by mouth daily. Take 2 tablets daily for BP  . bisoprolol-hctz  10-6.25 MG Take 1 tablet by mouth daily.  . citalopram  40 MG tablet Take 1 tablet (40 mg total) by mouth daily.  . cloNIDine 0.2 MG tablet TAKE 1 TABLET EVERY DAY  . doxazosin  4 MG tablet Take 1 tablet at bedtime for BP and Prostate  . ezetimibe  10 MG tablet Take 1 tablet (10 mg total) by mouth daily. For Cholesterol  . Finasteride  5 MG tablet 1/2-1 tab po qd  . ADVAIR 100-50  Inhale 1 puff into the lungs 2 (two) times daily.  . furosemide40 MG tablet Take 1 tablet (40 mg total) by mouth daily. For BP & Fluid  . Gabapentin 300 MG capsule Take 1 capsule  3  times daily  . ibuprofen  200 MG tablet   . Loratadine 10 MG CAPS   . metFORMIN  1000 MG tablet Take 1 tab 2  times daily with a meal.  . minoxidil  10 MG tablet Take 1 tab daily.  Marland Kitchen NEXIUM 20 MG capsule   . omeprazole  20 MG capsule Take 1 capsule (20 mg total) by mouth daily.  . KCl 20 meq  Take 1 tablet (20 mEq total) by mouth 2 (two) times daily.  . pravastatin (PRAVACHOL) 40 MG tablet Take 1 tablet (40 mg total) by mouth every evening.  Marland Kitchen VITAMIN D Take 1 tablet by mouth daily.  . Vitamins A & D 5000-400 UNITS CAPS    Current Problems (verified) Patient Active Problem List   Diagnosis Date Noted  . Depression, controlled 11/25/2014  . T2_NIDDM 06/22/2014  . Medication management 11/29/2013  . Vitamin D deficiency 08/24/2013  . Hypertension   . Obesity   . Hyperlipidemia   . GERD 02/20/2010   Screening Tests Health Maintenance  Topic Date Due  . HEMOGLOBIN A1C  12/22/2014  . INFLUENZA VACCINE  02/11/2015  . OPHTHALMOLOGY EXAM  06/01/2015  . FOOT EXAM   06/23/2015  . URINE MICROALBUMIN  06/23/2015  . COLONOSCOPY  07/13/2021  . TETANUS/TDAP  08/07/2024  . ZOSTAVAX  Completed  . PNA vac Low Risk Adult  Completed   Immunization History  Administered Date(s) Administered  . DT 08/07/2014  . Influenza, High Dose Seasonal PF 05/08/2013  . Pneumococcal Conjugate-13 06/22/2014  . Pneumococcal Polysaccharide-23 05/08/2013  . Td 07/13/2001  . Zoster 06/13/2013   Preventative care: Last colonoscopy: 2013  History reviewed: allergies, current medications, past family history, past medical history, past social history, past surgical history and problem list  Risk Factors: Tobacco History  Substance Use Topics  . Smoking status: Never Smoker   . Smokeless tobacco: Never Used  . Alcohol Use: No     Comment: Rare   He does not smoke.  Patient is not a former smoker. Are there smokers in your home (other than you)?  No  Alcohol Current alcohol use: none  Caffeine Current caffeine use: denies use  Exercise Current exercise: yard work  Nutrition/Diet Current diet: in  general, an "unhealthy" diet  Cardiac risk factors: advanced age (older than 65 for men, 64 for women), diabetes mellitus, dyslipidemia, hypertension, male gender, obesity (BMI >= 30 kg/m2) and sedentary lifestyle.  Depression Screen (Note: if answer to either of the following is "Yes", a more complete depression screening is indicated)   Q1: Over the past two weeks, have you felt down, depressed or hopeless? No  Q2: Over the past two weeks, have you felt little interest or pleasure in doing things? No  Have you lost interest or pleasure in daily life? No  Do you often feel hopeless? No  Do you cry easily over simple problems? No  Activities of Daily Living In your present state of health, do you have any difficulty performing the following activities?:  Driving? No Managing money?  No Feeding yourself? No Getting from bed to chair? No Climbing a flight of  stairs? No Preparing food and eating?: No Bathing or showering? No Getting dressed: No Getting to the toilet? No Using the toilet:No Moving around from place to place: No In the past year have you fallen or had a near fall?:No   Are you sexually active?  Yes  Do you have more than one partner?  No  Vision Difficulties: No  Hearing Difficulties: No Do you often ask people to speak up or repeat themselves? No Do you experience ringing or noises in your ears? No Do you have difficulty understanding soft or whispered voices? No  Cognition  Do you feel that you have a problem with memory?No  Do you often misplace items? No  Do you feel safe at home?  Yes  Advanced directives Does patient have a [place]? Yes Does patient have a Living Will? Yes  Past Medical History  Diagnosis Date  . DM   . GERD   . HTN (hypertension)     Normal Cath 09/2012  . Obesity   . Hyperlipidemia   . Nephrolithiasis   . Vitamin D deficiency   . Neuropathy due to secondary diabetes mellitus    Past Surgical History  Procedure Laterality Date  . Back surgery    . Carpel tunnal    . Elbow surgery     ROS: Constitutional: Denies fever, chills, weight loss/gain, headaches, insomnia, fatigue, night sweats or change in appetite. Eyes: Denies redness, blurred vision, diplopia, discharge, itchy or watery eyes.  ENT: Denies discharge, congestion, post nasal drip, epistaxis, sore throat, earache, hearing loss, dental pain, Tinnitus, Vertigo, Sinus pain or snoring.  Cardio: Denies chest pain, palpitations, irregular heartbeat, syncope, dyspnea, diaphoresis, orthopnea, PND, claudication or edema Respiratory: denies cough, dyspnea, DOE, pleurisy, hoarseness, laryngitis or wheezing.  Gastrointestinal: Denies dysphagia, heartburn, reflux, water brash, pain, cramps, nausea, vomiting, bloating, diarrhea, constipation, hematemesis, melena, hematochezia, jaundice or  hemorrhoids Genitourinary: Denies dysuria, frequency, urgency, nocturia, hesitancy, discharge, hematuria or flank pain Musculoskeletal: Denies arthralgia, myalgia, stiffness, Jt. Swelling, pain, limp or strain/sprain. Denies Falls. Skin: Denies puritis, rash, hives, warts, acne, eczema or change in skin lesion Neuro: No weakness, tremor, incoordination, spasms, paresthesia or pain Psychiatric: Denies confusion, memory loss or sensory loss. Denies Depression. Endocrine: Denies change in weight, skin, hair change, nocturia, and paresthesia, diabetic polys, visual blurring or hyper / hypo glycemic episodes.  Heme/Lymph: No excessive bleeding, bruising or enlarged lymph nodes.  Objective:     BP 136/62   Pulse 48  Temp 97.6 F   Resp 16  Ht 5' 8.5"   Wt 223 lb  BMI 33.41  General Appearance:  Alert  WD/WN, male  in no apparent distress. Eyes: PERRLA, EOMs nl, conjunctiva normal, normal fundi and vessels. Sinuses: No frontal/maxillary tenderness ENT/Mouth: EACs patent / TMs  nl. Nares clear without erythema, swelling, mucoid exudates. Oral hygiene is good. No erythema, swelling, or exudate. Tongue normal, non-obstructing. Tonsils not swollen or erythematous. Hearing normal.  Neck: Supple, thyroid normal. No bruits, nodes or JVD. Respiratory: Respiratory effort normal.  BS equal and clear bilateral without rales, rhonci, wheezing or stridor. Cardio: Heart sounds are normal with regular rate and rhythm and no murmurs, rubs or gallops. Peripheral pulses are normal and equal bilaterally without edema. No aortic or femoral bruits. Chest: symmetric with normal excursions and percussion.  Abdomen: Flat, soft, with nl bowel sounds. Nontender, no guarding, rebound, hernias, masses, or organomegaly.  Lymphatics: Non tender without lymphadenopathy.  Musculoskeletal: Full ROM all peripheral extremities, joint stability, 5/5 strength, and normal gait. Skin: Warm and dry without rashes, lesions,  cyanosis, clubbing or  ecchymosis.  Neuro: Cranial nerves intact, reflexes equal bilaterally. Normal muscle tone, no cerebellar symptoms. Sensation intact.  Pysch: Alert and oriented X 3 with normal affect, insight and judgment appropriate.   Cognitive Testing  Alert? Yes  Normal Appearance? Yes  Oriented to person? Yes  Place? Yes   Time? Yes  Recall of three objects?  Yes  Can perform simple calculations? Yes  Displays appropriate judgment? Yes  Can read the correct time from a watch/clock? Yes  Medicare Attestation I have personally reviewed: The patient's medical and social history Their use of alcohol, tobacco or illicit drugs Their current medications and supplements The patient's functional ability including ADLs,fall risks, home safety risks, cognitive, and hearing and visual impairment Diet and physical activities Evidence for depression or mood disorders  The patient's weight, height, BMI, and visual acuity have been recorded in the chart.  I have made referrals, counseling, and provided education to the patient based on review of the above and I have provided the patient with a written personalized care plan for preventive services.  Over 40 minutes of exam, counseling, chart review was performed.  Bertie Simien DAVID, MD   11/26/2014

## 2014-11-25 NOTE — Patient Instructions (Signed)

## 2014-11-26 ENCOUNTER — Ambulatory Visit (INDEPENDENT_AMBULATORY_CARE_PROVIDER_SITE_OTHER): Payer: Commercial Managed Care - HMO | Admitting: Internal Medicine

## 2014-11-26 ENCOUNTER — Encounter: Payer: Self-pay | Admitting: Internal Medicine

## 2014-11-26 VITALS — BP 136/62 | HR 48 | Temp 97.6°F | Resp 16 | Ht 68.5 in | Wt 223.0 lb

## 2014-11-26 DIAGNOSIS — E559 Vitamin D deficiency, unspecified: Secondary | ICD-10-CM

## 2014-11-26 DIAGNOSIS — R6889 Other general symptoms and signs: Secondary | ICD-10-CM

## 2014-11-26 DIAGNOSIS — E119 Type 2 diabetes mellitus without complications: Secondary | ICD-10-CM

## 2014-11-26 DIAGNOSIS — E669 Obesity, unspecified: Secondary | ICD-10-CM

## 2014-11-26 DIAGNOSIS — Z0000 Encounter for general adult medical examination without abnormal findings: Secondary | ICD-10-CM

## 2014-11-26 DIAGNOSIS — Z0001 Encounter for general adult medical examination with abnormal findings: Secondary | ICD-10-CM

## 2014-11-26 DIAGNOSIS — F329 Major depressive disorder, single episode, unspecified: Secondary | ICD-10-CM | POA: Diagnosis not present

## 2014-11-26 DIAGNOSIS — Z79899 Other long term (current) drug therapy: Secondary | ICD-10-CM | POA: Diagnosis not present

## 2014-11-26 DIAGNOSIS — E785 Hyperlipidemia, unspecified: Secondary | ICD-10-CM | POA: Diagnosis not present

## 2014-11-26 DIAGNOSIS — F32A Depression, unspecified: Secondary | ICD-10-CM

## 2014-11-26 DIAGNOSIS — I10 Essential (primary) hypertension: Secondary | ICD-10-CM

## 2014-11-26 DIAGNOSIS — Z1331 Encounter for screening for depression: Secondary | ICD-10-CM

## 2014-11-26 DIAGNOSIS — Z9181 History of falling: Secondary | ICD-10-CM

## 2014-11-26 DIAGNOSIS — K219 Gastro-esophageal reflux disease without esophagitis: Secondary | ICD-10-CM

## 2014-11-26 LAB — TSH: TSH: 1.088 u[IU]/mL (ref 0.350–4.500)

## 2014-11-26 LAB — CBC WITH DIFFERENTIAL/PLATELET
BASOS ABS: 0 10*3/uL (ref 0.0–0.1)
BASOS PCT: 0 % (ref 0–1)
EOS PCT: 1 % (ref 0–5)
Eosinophils Absolute: 0.1 10*3/uL (ref 0.0–0.7)
HEMATOCRIT: 38.6 % — AB (ref 39.0–52.0)
Hemoglobin: 12.2 g/dL — ABNORMAL LOW (ref 13.0–17.0)
LYMPHS PCT: 29 % (ref 12–46)
Lymphs Abs: 1.8 10*3/uL (ref 0.7–4.0)
MCH: 24.8 pg — ABNORMAL LOW (ref 26.0–34.0)
MCHC: 31.6 g/dL (ref 30.0–36.0)
MCV: 78.6 fL (ref 78.0–100.0)
MPV: 9.3 fL (ref 8.6–12.4)
Monocytes Absolute: 0.6 10*3/uL (ref 0.1–1.0)
Monocytes Relative: 10 % (ref 3–12)
Neutro Abs: 3.7 10*3/uL (ref 1.7–7.7)
Neutrophils Relative %: 60 % (ref 43–77)
Platelets: 223 10*3/uL (ref 150–400)
RBC: 4.91 MIL/uL (ref 4.22–5.81)
RDW: 18.7 % — ABNORMAL HIGH (ref 11.5–15.5)
WBC: 6.2 10*3/uL (ref 4.0–10.5)

## 2014-11-26 LAB — LIPID PANEL
CHOL/HDL RATIO: 6.3 ratio
Cholesterol: 190 mg/dL (ref 0–200)
HDL: 30 mg/dL — AB (ref >=40)
LDL CALC: 120 mg/dL — AB (ref 0–99)
Triglycerides: 201 mg/dL — ABNORMAL HIGH (ref <150)
VLDL: 40 mg/dL (ref 0–40)

## 2014-11-26 LAB — HEMOGLOBIN A1C
Hgb A1c MFr Bld: 6.9 % — ABNORMAL HIGH (ref <5.7)
MEAN PLASMA GLUCOSE: 151 mg/dL — AB (ref <117)

## 2014-11-26 LAB — HEPATIC FUNCTION PANEL
ALT: 13 U/L (ref 0–53)
AST: 14 U/L (ref 0–37)
Albumin: 3.8 g/dL (ref 3.5–5.2)
Alkaline Phosphatase: 42 U/L (ref 39–117)
BILIRUBIN TOTAL: 0.4 mg/dL (ref 0.2–1.2)
Bilirubin, Direct: 0.1 mg/dL (ref 0.0–0.3)
Indirect Bilirubin: 0.3 mg/dL (ref 0.2–1.2)
Total Protein: 6.3 g/dL (ref 6.0–8.3)

## 2014-11-26 LAB — MAGNESIUM: MAGNESIUM: 1.7 mg/dL (ref 1.5–2.5)

## 2014-11-27 LAB — VITAMIN D 25 HYDROXY (VIT D DEFICIENCY, FRACTURES): VIT D 25 HYDROXY: 66 ng/mL (ref 30–100)

## 2014-11-27 LAB — INSULIN, RANDOM: INSULIN: 10.8 u[IU]/mL (ref 2.0–19.6)

## 2014-11-28 ENCOUNTER — Encounter: Payer: Self-pay | Admitting: Internal Medicine

## 2014-12-12 DIAGNOSIS — G4733 Obstructive sleep apnea (adult) (pediatric): Secondary | ICD-10-CM | POA: Diagnosis not present

## 2014-12-12 DIAGNOSIS — R0683 Snoring: Secondary | ICD-10-CM | POA: Diagnosis not present

## 2014-12-12 DIAGNOSIS — G4710 Hypersomnia, unspecified: Secondary | ICD-10-CM | POA: Diagnosis not present

## 2014-12-17 ENCOUNTER — Other Ambulatory Visit: Payer: Self-pay | Admitting: Internal Medicine

## 2014-12-17 ENCOUNTER — Encounter: Payer: Self-pay | Admitting: Internal Medicine

## 2014-12-17 MED ORDER — EZETIMIBE 10 MG PO TABS: ORAL_TABLET | ORAL | Status: DC

## 2014-12-17 MED ORDER — GABAPENTIN 300 MG PO CAPS: ORAL_CAPSULE | ORAL | Status: DC

## 2015-01-15 ENCOUNTER — Encounter: Payer: Self-pay | Admitting: Internal Medicine

## 2015-01-15 ENCOUNTER — Ambulatory Visit (INDEPENDENT_AMBULATORY_CARE_PROVIDER_SITE_OTHER): Payer: Commercial Managed Care - HMO | Admitting: Internal Medicine

## 2015-01-15 VITALS — BP 160/82 | HR 70 | Temp 98.2°F | Resp 18 | Ht 68.5 in

## 2015-01-15 DIAGNOSIS — M25561 Pain in right knee: Secondary | ICD-10-CM | POA: Diagnosis not present

## 2015-01-15 DIAGNOSIS — M25569 Pain in unspecified knee: Secondary | ICD-10-CM | POA: Diagnosis not present

## 2015-01-15 LAB — URIC ACID: Uric Acid, Serum: 4.5 mg/dL (ref 4.0–7.8)

## 2015-01-15 MED ORDER — PREDNISONE 20 MG PO TABS: ORAL_TABLET | ORAL | Status: DC

## 2015-01-15 MED ORDER — HYDROCODONE-ACETAMINOPHEN 5-325 MG PO TABS: 2.0000 | ORAL_TABLET | ORAL | Status: DC | PRN

## 2015-01-15 NOTE — Progress Notes (Signed)
   Subjective:    Patient ID: Timothy Boyer, male    DOB: 1945-11-29, 69 y.o.   MRN: [id number]  Knee Pain  Pertinent negatives include no numbness.  Patient presents to the office for evaluation of right knee pain which started on Friday. He reports it has been sore to the touch and has been red, warm, and swollen.  He has no injury that he can think of.  He reports that he does a lot of kneeling for work.  He reports that he has been icing his knee and elevating his knee.  He is taking Aleve twice daily.  He reports that this does not help at all.  He has no history of knee injury.  He does not have a history of gout personally,  But his sister has had gout.  He reports that touching and walking hurts worse.      Review of Systems  Constitutional: Negative for fever, chills and fatigue.  Gastrointestinal: Negative for nausea and vomiting.  Musculoskeletal: Positive for joint swelling, arthralgias and gait problem.  Neurological: Negative for numbness.       Objective:   Physical Exam  Constitutional: He is oriented to person, place, and time. He appears well-developed and well-nourished. No distress.  HENT:  Head: Normocephalic and atraumatic.  Mouth/Throat: Oropharynx is clear and moist. No oropharyngeal exudate.  Eyes: Conjunctivae are normal. No scleral icterus.  Neck: Normal range of motion. Neck supple. No JVD present. No thyromegaly present.  Cardiovascular: Normal rate, regular rhythm, normal heart sounds and intact distal pulses.  Exam reveals no gallop and no friction rub.   No murmur heard. Pulmonary/Chest: Effort normal and breath sounds normal. No respiratory distress. He has no wheezes. He has no rales. He exhibits no tenderness.  Musculoskeletal: Normal range of motion.       Right knee: He exhibits swelling, effusion and erythema. He exhibits normal range of motion, no ecchymosis, no deformity, no laceration, normal alignment, no LCL laxity, normal patellar mobility, no  bony tenderness, normal meniscus and no MCL laxity. Tenderness found. Medial joint line and lateral joint line tenderness noted. No MCL, no LCL and no patellar tendon tenderness noted.  Knee with moderate effusion to the right knee with tenderness to palpation of the right knee.  Tenderness over the joint line.  Negative anterior and posterior drawer.  Antalgic gait.  Lymphadenopathy:    He has no cervical adenopathy.  Neurological: He is alert and oriented to person, place, and time.  Skin: Skin is warm and dry. He is not diaphoretic.  Psychiatric: He has a normal mood and affect. His behavior is normal. Judgment and thought content normal.  Nursing note and vitals reviewed.         Assessment & Plan:    1. Right knee pain -possible gout vs. OA vs. Possible bursitis.  Check uric acid level. Cont naproxen.  Norco as needed for pain.  RICE therapy. Prednisone taper. - HYDROcodone-acetaminophen (NORCO) 5-325 MG per tablet; Take 2 tablets by mouth every 4 (four) hours as needed.  Dispense: 20 tablet; Refill: 0 - Uric acid - predniSONE (DELTASONE) 20 MG tablet; 3 tabs po day one, then 2 tabs daily x 4 days  Dispense: 11 tablet; Refill: 0

## 2015-01-15 NOTE — Patient Instructions (Signed)

## 2015-01-21 ENCOUNTER — Encounter: Payer: Self-pay | Admitting: *Deleted

## 2015-01-21 ENCOUNTER — Other Ambulatory Visit: Payer: Self-pay | Admitting: Internal Medicine

## 2015-01-21 DIAGNOSIS — M25569 Pain in unspecified knee: Secondary | ICD-10-CM

## 2015-01-21 NOTE — Progress Notes (Signed)
MyChart message sent to patient.

## 2015-01-28 DIAGNOSIS — M25561 Pain in right knee: Secondary | ICD-10-CM | POA: Diagnosis not present

## 2015-01-29 ENCOUNTER — Other Ambulatory Visit: Payer: Self-pay | Admitting: Internal Medicine

## 2015-01-30 ENCOUNTER — Other Ambulatory Visit: Payer: Self-pay | Admitting: *Deleted

## 2015-01-30 MED ORDER — FUROSEMIDE 40 MG PO TABS: 40.0000 mg | ORAL_TABLET | Freq: Every day | ORAL | Status: AC

## 2015-02-01 DIAGNOSIS — M25561 Pain in right knee: Secondary | ICD-10-CM | POA: Diagnosis not present

## 2015-02-06 DIAGNOSIS — M25561 Pain in right knee: Secondary | ICD-10-CM | POA: Diagnosis not present

## 2015-02-14 ENCOUNTER — Encounter: Payer: Self-pay | Admitting: Internal Medicine

## 2015-03-12 ENCOUNTER — Ambulatory Visit: Payer: Self-pay | Admitting: Internal Medicine

## 2015-03-12 ENCOUNTER — Encounter: Payer: Self-pay | Admitting: Physician Assistant

## 2015-03-12 ENCOUNTER — Ambulatory Visit (INDEPENDENT_AMBULATORY_CARE_PROVIDER_SITE_OTHER): Payer: Commercial Managed Care - HMO | Admitting: Physician Assistant

## 2015-03-12 VITALS — BP 164/80 | HR 68 | Temp 97.9°F | Resp 16 | Ht 68.5 in | Wt 222.4 lb

## 2015-03-12 DIAGNOSIS — I10 Essential (primary) hypertension: Secondary | ICD-10-CM | POA: Diagnosis not present

## 2015-03-12 DIAGNOSIS — E559 Vitamin D deficiency, unspecified: Secondary | ICD-10-CM | POA: Diagnosis not present

## 2015-03-12 DIAGNOSIS — E669 Obesity, unspecified: Secondary | ICD-10-CM | POA: Diagnosis not present

## 2015-03-12 DIAGNOSIS — Z79899 Other long term (current) drug therapy: Secondary | ICD-10-CM | POA: Diagnosis not present

## 2015-03-12 DIAGNOSIS — E119 Type 2 diabetes mellitus without complications: Secondary | ICD-10-CM

## 2015-03-12 DIAGNOSIS — Z0000 Encounter for general adult medical examination without abnormal findings: Secondary | ICD-10-CM

## 2015-03-12 DIAGNOSIS — E785 Hyperlipidemia, unspecified: Secondary | ICD-10-CM | POA: Diagnosis not present

## 2015-03-12 DIAGNOSIS — M2550 Pain in unspecified joint: Secondary | ICD-10-CM | POA: Diagnosis not present

## 2015-03-12 DIAGNOSIS — E1142 Type 2 diabetes mellitus with diabetic polyneuropathy: Secondary | ICD-10-CM | POA: Insufficient documentation

## 2015-03-12 LAB — LIPID PANEL
CHOL/HDL RATIO: 5.3 ratio — AB (ref <=5.0)
CHOLESTEROL: 174 mg/dL (ref 125–200)
HDL: 33 mg/dL — AB (ref >=40)
LDL Cholesterol: 104 mg/dL (ref <130)
Triglycerides: 184 mg/dL — ABNORMAL HIGH (ref <150)
VLDL: 37 mg/dL — AB (ref <30)

## 2015-03-12 LAB — CBC WITH DIFFERENTIAL/PLATELET
Basophils Absolute: 0 10*3/uL (ref 0.0–0.1)
Basophils Relative: 0 % (ref 0–1)
Eosinophils Absolute: 0.1 10*3/uL (ref 0.0–0.7)
Eosinophils Relative: 2 % (ref 0–5)
HCT: 38.8 % — ABNORMAL LOW (ref 39.0–52.0)
HEMOGLOBIN: 12.8 g/dL — AB (ref 13.0–17.0)
LYMPHS ABS: 2.2 10*3/uL (ref 0.7–4.0)
Lymphocytes Relative: 35 % (ref 12–46)
MCH: 27.2 pg (ref 26.0–34.0)
MCHC: 33 g/dL (ref 30.0–36.0)
MCV: 82.4 fL (ref 78.0–100.0)
MONOS PCT: 8 % (ref 3–12)
MPV: 9.3 fL (ref 8.6–12.4)
Monocytes Absolute: 0.5 10*3/uL (ref 0.1–1.0)
NEUTROS ABS: 3.4 10*3/uL (ref 1.7–7.7)
NEUTROS PCT: 55 % (ref 43–77)
Platelets: 267 10*3/uL (ref 150–400)
RBC: 4.71 MIL/uL (ref 4.22–5.81)
RDW: 16.5 % — ABNORMAL HIGH (ref 11.5–15.5)
WBC: 6.2 10*3/uL (ref 4.0–10.5)

## 2015-03-12 LAB — TSH: TSH: 1.531 u[IU]/mL (ref 0.350–4.500)

## 2015-03-12 LAB — HEPATIC FUNCTION PANEL
ALBUMIN: 4 g/dL (ref 3.6–5.1)
ALT: 14 U/L (ref 9–46)
AST: 17 U/L (ref 10–35)
Alkaline Phosphatase: 62 U/L (ref 40–115)
BILIRUBIN DIRECT: 0.1 mg/dL (ref <=0.2)
Indirect Bilirubin: 0.4 mg/dL (ref 0.2–1.2)
Total Bilirubin: 0.5 mg/dL (ref 0.2–1.2)
Total Protein: 6.9 g/dL (ref 6.1–8.1)

## 2015-03-12 LAB — BASIC METABOLIC PANEL WITH GFR
BUN: 22 mg/dL (ref 7–25)
CHLORIDE: 102 mmol/L (ref 98–110)
CO2: 31 mmol/L (ref 20–31)
CREATININE: 0.95 mg/dL (ref 0.70–1.25)
Calcium: 9.7 mg/dL (ref 8.6–10.3)
GFR, Est African American: >89 mL/min (ref >=60)
GFR, Est Non African American: 82 mL/min (ref >=60)
Glucose, Bld: 118 mg/dL — ABNORMAL HIGH (ref 65–99)
Potassium: 4 mmol/L (ref 3.5–5.3)
Sodium: 140 mmol/L (ref 135–146)

## 2015-03-12 LAB — HEMOGLOBIN A1C
Hgb A1c MFr Bld: 6.6 % — ABNORMAL HIGH (ref <5.7)
Mean Plasma Glucose: 143 mg/dL — ABNORMAL HIGH (ref <117)

## 2015-03-12 LAB — MAGNESIUM: Magnesium: 1.8 mg/dL (ref 1.5–2.5)

## 2015-03-12 MED ORDER — COLCHICINE 0.6 MG PO TABS: 0.6000 mg | ORAL_TABLET | Freq: Every day | ORAL | Status: DC

## 2015-03-12 NOTE — Patient Instructions (Signed)
Monitor your blood pressure at home, if it is above 140/90 consistently then increase to 2 pill of s the benazepril a day  . Go to the ER if any CP, SOB, nausea, dizziness, severe HA, changes vision/speech  DASH Eating Plan DASH stands for "Dietary Approaches to Stop Hypertension." The DASH eating plan is a healthy eating plan that has been shown to reduce high blood pressure (hypertension). Additional health benefits may include reducing the risk of type 2 diabetes mellitus, heart disease, and stroke. The DASH eating plan may also help with weight loss. WHAT DO I NEED TO KNOW ABOUT THE DASH EATING PLAN? For the DASH eating plan, you will follow these general guidelines:  Choose foods with a percent daily value for sodium of less than 5% (as listed on the food label).  Use salt-free seasonings or herbs instead of table salt or sea salt.  Check with your health care provider or pharmacist before using salt substitutes.  Eat lower-sodium products, often labeled as "lower sodium" or "no salt added."  Eat fresh foods.  Eat more vegetables, fruits, and low-fat dairy products.  Choose whole grains. Look for the word "whole" as the first word in the ingredient list.  Choose fish and skinless chicken or [place] more often than red meat. Limit fish, poultry, and meat to 6 oz (170 g) each day.  Limit sweets, desserts, sugars, and sugary drinks.  Choose heart-healthy fats.  Limit cheese to 1 oz (28 g) per day.  Eat more home-cooked food and less restaurant, buffet, and fast food.  Limit fried foods.  Cook foods using methods other than frying.  Limit canned vegetables. If you do use them, rinse them well to decrease the sodium.  When eating at a restaurant, ask that your food be prepared with less salt, or no salt if possible. WHAT FOODS CAN I EAT? Seek help from a dietitian for individual calorie needs. Grains Whole grain or whole wheat bread. Brown rice. Whole grain or whole wheat  pasta. Quinoa, bulgur, and whole grain cereals. Low-sodium cereals. Corn or whole wheat flour tortillas. Whole grain cornbread. Whole grain crackers. Low-sodium crackers. Vegetables Fresh or frozen vegetables (raw, steamed, roasted, or grilled). Low-sodium or reduced-sodium tomato and vegetable juices. Low-sodium or reduced-sodium tomato sauce and paste. Low-sodium or reduced-sodium canned vegetables.  Fruits All fresh, canned (in natural juice), or frozen fruits. Meat and Other Protein Products Ground beef (85% or leaner), grass-fed beef, or beef trimmed of fat. Skinless chicken or [place]. Ground chicken or [place]. Pork trimmed of fat. All fish and seafood. Eggs. Dried beans, peas, or lentils. Unsalted nuts and seeds. Unsalted canned beans. Dairy Low-fat dairy products, such as skim or 1% milk, 2% or reduced-fat cheeses, low-fat ricotta or cottage cheese, or plain low-fat yogurt. Low-sodium or reduced-sodium cheeses. Fats and Oils Tub margarines without trans fats. Light or reduced-fat mayonnaise and salad dressings (reduced sodium). Avocado. Safflower, olive, or canola oils. Natural peanut or almond butter. Other Unsalted popcorn and pretzels. The items listed above may not be a complete list of recommended foods or beverages. Contact your dietitian for more options. WHAT FOODS ARE NOT RECOMMENDED? Grains White bread. White pasta. White rice. Refined cornbread. Bagels and croissants. Crackers that contain trans fat. Vegetables Creamed or fried vegetables. Vegetables in a cheese sauce. Regular canned vegetables. Regular canned tomato sauce and paste. Regular tomato and vegetable juices. Fruits Dried fruits. Canned fruit in light or heavy syrup. Fruit juice. Meat and Other Protein Products Fatty cuts of  meat. Ribs, chicken wings, bacon, sausage, bologna, salami, chitterlings, fatback, hot dogs, bratwurst, and packaged luncheon meats. Salted nuts and seeds. Canned beans with salt. Dairy Whole  or 2% milk, cream, half-and-half, and cream cheese. Whole-fat or sweetened yogurt. Full-fat cheeses or blue cheese. Nondairy creamers and whipped toppings. Processed cheese, cheese spreads, or cheese curds. Condiments Onion and garlic salt, seasoned salt, table salt, and sea salt. Canned and packaged gravies. Worcestershire sauce. Tartar sauce. Barbecue sauce. Teriyaki sauce. Soy sauce, including reduced sodium. Steak sauce. Fish sauce. Oyster sauce. Cocktail sauce. Horseradish. Ketchup and mustard. Meat flavorings and tenderizers. Bouillon cubes. Hot sauce. Tabasco sauce. Marinades. Taco seasonings. Relishes. Fats and Oils Butter, stick margarine, lard, shortening, ghee, and bacon fat. Coconut, palm kernel, or palm oils. Regular salad dressings. Other Pickles and olives. Salted popcorn and pretzels. The items listed above may not be a complete list of foods and beverages to avoid. Contact your dietitian for more information. WHERE CAN I FIND MORE INFORMATION? National Heart, Lung, and Blood Institute: travelstabloid.com Document Released: 06/18/2011 Document Revised: 11/13/2013 Document Reviewed: 05/03/2013 [facility] Patient Information 2015 [place], [place]. This information is not intended to replace advice given to you by your health care provider. Make sure you discuss any questions you have with your health care provider.

## 2015-03-12 NOTE — Progress Notes (Signed)
Assessment and Plan:  1. Hypertension -Continue medication, monitor blood pressure at home. Continue DASH diet.  Reminder to go to the ER if any CP, SOB, nausea, dizziness, severe HA, changes vision/speech, left arm numbness and tingling and jaw pain.  2. Cholesterol -Continue diet and exercise. Check cholesterol.   3. Diabetes with diabetic polyneuropathy -Continue diet and exercise. Check A1C  4. Vitamin D Def - check level and continue medications.   5. Obesity with co morbidities - long discussion about weight loss, diet, and exercise  6. Joint pain Possible pseudogout, will give colchicine to try daily, denies tick exposure  Continue diet and meds as discussed. Further disposition pending results of labs. Discussed med's effects and SE's.   Over 30 minutes of exam, counseling, chart review, and critical decision making was performed   HPI 69 y.o. male  presents for 3 month follow up on hypertension, cholesterol, diabetes and vitamin D deficiency.   His blood pressure is not checked at home regularly, he has only been taking one of his benazepril a day, last checked a month ago and was 130/90, today his BP is BP: (!) 164/80 mmHg.  He does not workout,[place]. He denies chest pain, shortness of breath, dizziness.  He is on cholesterol medication, intolerant of statins so he is on zetia and denies myalgias. His cholesterol is not at goal. The cholesterol was:   Lab Results  Component Value Date   CHOL 190 11/26/2014   HDL 30* 11/26/2014   LDLCALC 120* 11/26/2014   TRIG 201* 11/26/2014   CHOLHDL 6.3 11/26/2014    He has been working on diet and exercise for diabetes with diabetic polyneuropathy, on gapabentin, he is on bASA, he is on ACE/ARB, he is on metformin, and denies  polydipsia, polyuria and visual disturbances. Last A1C was:  Lab Results  Component Value Date   HGBA1C 6.9* 11/26/2014    Patient is on Vitamin D  supplement. Lab Results  Component Value Date   VD25OH 66 11/26/2014   He fell at work on his left 7-8th rib on a piece of steel 1 week ago, has some pain with coughing/deep breathing.  He has had some right knee pain that has gotten better but now he has left knee pain. He had a normal uric acid, does have warm, swelling, in muliptle joints, could be pseudogout, but will rule out RPR.  BMI is Body mass index is 33.32 kg/(m^2)., he is working on diet and exercise. Wt Readings from Last 3 Encounters:  03/12/15 222 lb 6.4 oz (100.88 kg)  11/26/14 223 lb (101.152 kg)  09/12/14 228 lb (103.42 kg)    Current Medications:  Current Outpatient Prescriptions on File Prior to Visit  Medication Sig Dispense Refill  . albuterol (PROAIR HFA) 108 (90 BASE) MCG/ACT inhaler Inhale 1-2 puffs into the lungs every 4 (four) hours as needed for wheezing or shortness of breath. 3 Inhaler 99  . BAYER LOW DOSE 81 MG EC tablet     . benazepril-hydrochlorthiazide (LOTENSIN HCT) 20-12.5 MG per tablet Take 2 tablets by mouth daily. Take 2 tablets daily for BP 180 tablet 3  . bisoprolol-hydrochlorothiazide (ZIAC) 10-6.25 MG per tablet TAKE 1 TABLET EVERY DAY 90 tablet 3  . cloNIDine (CATAPRES) 0.2 MG tablet TAKE 1 TABLET EVERY DAY 90 tablet 1  . finasteride (PROSCAR) 5 MG tablet 1/2-1 tab po qd 90 tablet 99  . Fluticasone-Salmeterol (ADVAIR) 100-50 MCG/DOSE AEPB Inhale  1 puff into the lungs 2 (two) times daily.    . furosemide (LASIX) 40 MG tablet Take 1 tablet (40 mg total) by mouth daily. For BP & Fluid 90 tablet 4  . gabapentin (NEURONTIN) 300 MG capsule TAKE 2 CAPSULES EVERY MORNING  AND TAKE 1 CAPSULE EVERY EVENING 270 capsule 1  . HYDROcodone-acetaminophen (NORCO) 5-325 MG per tablet Take 2 tablets by mouth every 4 (four) hours as needed. 20 tablet 0  . ibuprofen (ADVIL,MOTRIN) 200 MG tablet     . metFORMIN (GLUCOPHAGE) 1000 MG tablet Take 1 tablet (1,000 mg total) by mouth 2 (two) times daily with a meal. 180  tablet 3  . minoxidil (LONITEN) 10 MG tablet TAKE 1 TABLET EVERY DAY 90 tablet 3  . omeprazole (PRILOSEC) 20 MG capsule Take 1 capsule (20 mg total) by mouth daily. 90 capsule 3  . potassium chloride SA (K-DUR,KLOR-CON) 20 MEQ tablet Take 1 tablet (20 mEq total) by mouth 2 (two) times daily. 180 tablet 3  . predniSONE (DELTASONE) 20 MG tablet 3 tabs po day one, then 2 tabs daily x 4 days 11 tablet 0  . VITAMIN D, CHOLECALCIFEROL, PO Take 1 tablet by mouth daily.    . Vitamins A & D 5000-400 UNITS CAPS     . ZETIA 10 MG tablet TAKE 1 TABLET EVERY DAY FOR CHOLESTEROL 90 tablet 3   No current facility-administered medications on file prior to visit.   Medical History:  Past Medical History  Diagnosis Date  . DM   . GERD   . HTN (hypertension)     Normal Cath 09/2012  . Obesity   . Hyperlipidemia   . Nephrolithiasis   . Vitamin D deficiency   . Neuropathy due to secondary diabetes mellitus    Allergies:  Allergies  Allergen Reactions  . Augmentin [Amoxicillin-Pot Clavulanate]   . Lipitor [Atorvastatin]     tremor  . Lopid [Gemfibrozil]   . Penicillins   . Singulair [Montelukast Sodium]     Review of Systems:  Review of Systems  Constitutional: Negative.   HENT: Negative.   Eyes: Negative.   Respiratory: Negative.   Cardiovascular: Negative.   Gastrointestinal: Negative.   Genitourinary: Negative.   Musculoskeletal: Positive for myalgias, joint pain and falls. Negative for back pain and neck pain.  Skin: Negative.   Neurological: Negative.   Endo/Heme/Allergies: Negative.   Psychiatric/Behavioral: Negative.     Family history- Review and unchanged Social history- Review and unchanged Physical Exam: BP 164/80 mmHg  Pulse 68  Temp(Src) 97.9 F (36.6 C)  Resp 16  Ht 5' 8.5" (1.74 m)  Wt 222 lb 6.4 oz (100.88 kg)  BMI 33.32 kg/m2 Wt Readings from Last 3 Encounters:  03/12/15 222 lb 6.4 oz (100.88 kg)  11/26/14 223 lb (101.152 kg)  09/12/14 228 lb (103.42 kg)    General Appearance: Well nourished, in no apparent distress. Eyes: PERRLA, EOMs, conjunctiva no swelling or erythema Sinuses: No Frontal/maxillary tenderness ENT/Mouth: Ext aud canals clear, TMs without erythema, bulging. No erythema, swelling, or exudate on post pharynx.  Tonsils not swollen or erythematous. Hearing normal.  Neck: Supple, thyroid normal.  Respiratory: Respiratory effort normal, BS equal bilaterally without rales, rhonchi, wheezing or stridor.  Cardio: RRR with no MRGs. Brisk peripheral pulses without edema.  Abdomen: Soft, + BS.  Non tender, no guarding, rebound, hernias, masses. Lymphatics: Non tender without lymphadenopathy.  Musculoskeletal: Full ROM, 5/5 strength, Normal gait Skin: Warm, dry without rashes, lesions, ecchymosis.  Neuro: Cranial  nerves intact. No cerebellar symptoms.  Psych: Awake and oriented X 3, normal affect, Insight and Judgment appropriate.    Vicie Mutters, PA-C 9:07 AM [facility]

## 2015-03-13 ENCOUNTER — Other Ambulatory Visit: Payer: Self-pay

## 2015-03-13 LAB — VITAMIN D 25 HYDROXY (VIT D DEFICIENCY, FRACTURES): Vit D, 25-Hydroxy: 107 ng/mL — ABNORMAL HIGH (ref 30–100)

## 2015-03-13 LAB — SEDIMENTATION RATE: SED RATE: 13 mm/h (ref 0–20)

## 2015-03-13 LAB — RPR

## 2015-03-13 MED ORDER — COLCHICINE 0.6 MG PO TABS: 0.6000 mg | ORAL_TABLET | Freq: Every day | ORAL | Status: DC

## 2015-03-19 ENCOUNTER — Telehealth: Payer: Self-pay | Admitting: Physician Assistant

## 2015-03-19 ENCOUNTER — Other Ambulatory Visit: Payer: Self-pay | Admitting: Internal Medicine

## 2015-03-19 DIAGNOSIS — M25529 Pain in unspecified elbow: Secondary | ICD-10-CM

## 2015-03-19 DIAGNOSIS — R0781 Pleurodynia: Secondary | ICD-10-CM

## 2015-03-19 MED ORDER — HYDROCODONE-ACETAMINOPHEN 5-325 MG PO TABS: 1.0000 | ORAL_TABLET | Freq: Four times a day (QID) | ORAL | Status: DC | PRN

## 2015-03-19 NOTE — Telephone Encounter (Signed)
Patient calling to say still having left rib pain, fell a week and half a go at work on steel and having left rib pain, worse with cough/SOB.  Will get rib detail xray AT [facility] DUE TO COST, will do hydrocodone 5 mg # 30 NR  Also still having elbow pain, see note, will refer to ortho.

## 2015-03-27 DIAGNOSIS — M7022 Olecranon bursitis, left elbow: Secondary | ICD-10-CM | POA: Diagnosis not present

## 2015-04-09 DIAGNOSIS — H25093 Other age-related incipient cataract, bilateral: Secondary | ICD-10-CM | POA: Diagnosis not present

## 2015-04-09 DIAGNOSIS — H5202 Hypermetropia, left eye: Secondary | ICD-10-CM | POA: Diagnosis not present

## 2015-04-09 DIAGNOSIS — I10 Essential (primary) hypertension: Secondary | ICD-10-CM | POA: Diagnosis not present

## 2015-04-09 DIAGNOSIS — H524 Presbyopia: Secondary | ICD-10-CM | POA: Diagnosis not present

## 2015-04-09 DIAGNOSIS — H11003 Unspecified pterygium of eye, bilateral: Secondary | ICD-10-CM | POA: Diagnosis not present

## 2015-04-09 DIAGNOSIS — E119 Type 2 diabetes mellitus without complications: Secondary | ICD-10-CM | POA: Diagnosis not present

## 2015-04-09 DIAGNOSIS — H52223 Regular astigmatism, bilateral: Secondary | ICD-10-CM | POA: Diagnosis not present

## 2015-04-09 DIAGNOSIS — H5210 Myopia, unspecified eye: Secondary | ICD-10-CM | POA: Diagnosis not present

## 2015-04-25 ENCOUNTER — Encounter: Payer: Self-pay | Admitting: Internal Medicine

## 2015-04-26 ENCOUNTER — Other Ambulatory Visit: Payer: Self-pay | Admitting: Internal Medicine

## 2015-04-26 MED ORDER — POTASSIUM CHLORIDE CRYS ER 20 MEQ PO TBCR: 20.0000 meq | EXTENDED_RELEASE_TABLET | Freq: Two times a day (BID) | ORAL | Status: AC

## 2015-04-26 MED ORDER — EZETIMIBE 10 MG PO TABS: ORAL_TABLET | ORAL | Status: AC

## 2015-05-21 ENCOUNTER — Ambulatory Visit (INDEPENDENT_AMBULATORY_CARE_PROVIDER_SITE_OTHER): Payer: Commercial Managed Care - HMO | Admitting: *Deleted

## 2015-05-21 DIAGNOSIS — Z23 Encounter for immunization: Secondary | ICD-10-CM

## 2015-06-02 ENCOUNTER — Encounter: Payer: Self-pay | Admitting: *Deleted

## 2015-07-09 ENCOUNTER — Ambulatory Visit (INDEPENDENT_AMBULATORY_CARE_PROVIDER_SITE_OTHER): Payer: Commercial Managed Care - HMO | Admitting: Internal Medicine

## 2015-07-09 ENCOUNTER — Encounter: Payer: Self-pay | Admitting: Internal Medicine

## 2015-07-09 VITALS — BP 176/80 | HR 60 | Temp 97.0°F | Resp 16 | Ht 68.25 in | Wt 217.2 lb

## 2015-07-09 DIAGNOSIS — E785 Hyperlipidemia, unspecified: Secondary | ICD-10-CM | POA: Diagnosis not present

## 2015-07-09 DIAGNOSIS — E1121 Type 2 diabetes mellitus with diabetic nephropathy: Secondary | ICD-10-CM | POA: Diagnosis not present

## 2015-07-09 DIAGNOSIS — E1142 Type 2 diabetes mellitus with diabetic polyneuropathy: Secondary | ICD-10-CM

## 2015-07-09 DIAGNOSIS — I10 Essential (primary) hypertension: Secondary | ICD-10-CM | POA: Diagnosis not present

## 2015-07-09 DIAGNOSIS — F32A Depression, unspecified: Secondary | ICD-10-CM

## 2015-07-09 DIAGNOSIS — Z1212 Encounter for screening for malignant neoplasm of rectum: Secondary | ICD-10-CM

## 2015-07-09 DIAGNOSIS — Z125 Encounter for screening for malignant neoplasm of prostate: Secondary | ICD-10-CM

## 2015-07-09 DIAGNOSIS — Z0001 Encounter for general adult medical examination with abnormal findings: Secondary | ICD-10-CM

## 2015-07-09 DIAGNOSIS — Z79899 Other long term (current) drug therapy: Secondary | ICD-10-CM

## 2015-07-09 DIAGNOSIS — E559 Vitamin D deficiency, unspecified: Secondary | ICD-10-CM

## 2015-07-09 DIAGNOSIS — F329 Major depressive disorder, single episode, unspecified: Secondary | ICD-10-CM

## 2015-07-09 DIAGNOSIS — Z0000 Encounter for general adult medical examination without abnormal findings: Secondary | ICD-10-CM | POA: Diagnosis not present

## 2015-07-09 DIAGNOSIS — K219 Gastro-esophageal reflux disease without esophagitis: Secondary | ICD-10-CM

## 2015-07-09 DIAGNOSIS — E1129 Type 2 diabetes mellitus with other diabetic kidney complication: Secondary | ICD-10-CM | POA: Diagnosis not present

## 2015-07-09 LAB — LIPID PANEL
CHOLESTEROL: 178 mg/dL (ref 125–200)
HDL: 40 mg/dL (ref >=40)
LDL Cholesterol: 115 mg/dL (ref <130)
Total CHOL/HDL Ratio: 4.5 Ratio (ref <=5.0)
Triglycerides: 114 mg/dL (ref <150)
VLDL: 23 mg/dL (ref <30)

## 2015-07-09 LAB — CBC WITH DIFFERENTIAL/PLATELET
BASOS PCT: 0 % (ref 0–1)
Basophils Absolute: 0 10*3/uL (ref 0.0–0.1)
EOS ABS: 0.1 10*3/uL (ref 0.0–0.7)
Eosinophils Relative: 1 % (ref 0–5)
HCT: 40.1 % (ref 39.0–52.0)
HEMOGLOBIN: 12.8 g/dL — AB (ref 13.0–17.0)
LYMPHS ABS: 1.7 10*3/uL (ref 0.7–4.0)
Lymphocytes Relative: 32 % (ref 12–46)
MCH: 25.4 pg — AB (ref 26.0–34.0)
MCHC: 31.9 g/dL (ref 30.0–36.0)
MCV: 79.7 fL (ref 78.0–100.0)
MONO ABS: 0.5 10*3/uL (ref 0.1–1.0)
MONOS PCT: 9 % (ref 3–12)
MPV: 9.6 fL (ref 8.6–12.4)
NEUTROS ABS: 3 10*3/uL (ref 1.7–7.7)
Neutrophils Relative %: 58 % (ref 43–77)
Platelets: 240 10*3/uL (ref 150–400)
RBC: 5.03 MIL/uL (ref 4.22–5.81)
RDW: 15.9 % — ABNORMAL HIGH (ref 11.5–15.5)
WBC: 5.2 10*3/uL (ref 4.0–10.5)

## 2015-07-09 LAB — HEPATIC FUNCTION PANEL
ALBUMIN: 4.1 g/dL (ref 3.6–5.1)
ALT: 15 U/L (ref 9–46)
AST: 19 U/L (ref 10–35)
Alkaline Phosphatase: 50 U/L (ref 40–115)
BILIRUBIN DIRECT: 0.1 mg/dL (ref <=0.2)
Indirect Bilirubin: 0.5 mg/dL (ref 0.2–1.2)
TOTAL PROTEIN: 6.9 g/dL (ref 6.1–8.1)
Total Bilirubin: 0.6 mg/dL (ref 0.2–1.2)

## 2015-07-09 LAB — BASIC METABOLIC PANEL WITH GFR
BUN: 20 mg/dL (ref 7–25)
CALCIUM: 10.1 mg/dL (ref 8.6–10.3)
CO2: 28 mmol/L (ref 20–31)
Chloride: 100 mmol/L (ref 98–110)
Creat: 1 mg/dL (ref 0.70–1.25)
GFR, Est African American: 89 mL/min (ref >=60)
GFR, Est Non African American: 77 mL/min (ref >=60)
Glucose, Bld: 122 mg/dL — ABNORMAL HIGH (ref 65–99)
Potassium: 4.2 mmol/L (ref 3.5–5.3)
SODIUM: 140 mmol/L (ref 135–146)

## 2015-07-09 LAB — MAGNESIUM: MAGNESIUM: 1.8 mg/dL (ref 1.5–2.5)

## 2015-07-09 NOTE — Progress Notes (Signed)
Patient ID: Timothy Boyer, male   DOB: [birth year], 69 y.o.   MRN: [id number]  Annual  Screening/Preventative Visit And Comprehensive Evaluation & Examination     This very nice 69 y.o. MWM presents for presents for a Wellness/Preventative Visit & comprehensive evaluation and management of multiple medical co-morbidities.  Patient has been followed for HTN, T2_NIDDM, Hyperlipidemia and Vitamin D Deficiency. Patient also has OSA/CPAP and alleges compliance with improved sleep hygiene and less daytime fatigue.      Patient relates that he is in process of moving 2 hours away to [facility] in the [place] and he is advised that he needs to establish with a local PCP provider.      HTN predates since 69. Patient's BP has been controlled at home.Today's BP is 176/80 and rechecked at 144/76. Patient denies any cardiac symptoms as chest pain, palpitations, shortness of breath, dizziness or ankle swelling.     Patient's hyperlipidemia is controlled with diet and medications. Patient denies myalgias or other medication SE's. Last lipids were near goal with Cholesterol 174; HDL 33*; LDL 104; and sl elevated Triglycerides 184 on 03/12/2015.     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 Last A1c was 6.6% on 03/12/2015.     Finally, patient has history of Vitamin D Deficiency and last vitamin D was 107 on 03/12/2015 and he was advised to taper his dose. .    Medication Sig  . albuterol  HFA inhaler Inhale 1-2 puffs into the lungs every 4 (four) hours as needed for wheezing or shortness of breath.  Marland Kitchen BAYER  81 MG EC    . bisoprolol-hctz (ZIAC) 10-6.25  TAKE 1 TABLET EVERY DAY  . cloNIDine  0.2 MG  TAKE 1 TABLET EVERY DAY  . ezetimibe  10 MG  TAKE 1 TABLET EVERY DAY FOR CHOLESTEROL  . finasteride  5 MG  1/2-1 tab po qd  . ADVAIR 100-50  Inhale 1 puff into the lungs 2  (two) times daily.  . furosemide  40 MG  Take 1 tablet (40 mg total) by mouth daily. For BP & Fluid  . gabapentin  300 MG  TAKE 2 CAPSULES EVERY MORNING  AND TAKE 1 CAPSULE EVERY EVENING  . NORCO 5-325 MG  Take 1 tablet by mouth every 6 (six) hours as needed for moderate pain (Cough). Max: 4 tablets a day  . ibuprofen  200 MG    . metFORMIN  1000 MG tablet Take 1 tablet (1,000 mg total) by mouth 2 (two) times daily with a meal.  . minoxidil  10 MG tablet TAKE 1 TABLET EVERY DAY  . omeprazole ( 20 MG capsule Take 1 capsule (20 mg total) by mouth daily.  . potassium chloride SA  20 MEQ tablet Take 1 tablet (20 mEq total) by mouth 2 (two) times daily.  Marland Kitchen VITAMIN D Take 1 tablet by mouth daily.  . Vitamins A & D 5000-400 UNITS    . benazepril-hctz 20-12.5 MG per tablet Take 2 tablets by mouth daily. Take 2 tablets daily for BP  . colchicine 0.6 MG tablet Take 1 tablet (0.6 mg total) by mouth daily. (Patient not taking: Reported on 07/09/2015)   Allergies  Allergen Reactions  . Augmentin [Amoxicillin-Pot Clavulanate]   . Lipitor [Atorvastatin]     tremor  . Lopid [Gemfibrozil]   . Penicillins   . Singulair [employer]  Sodium]    Past Medical History  Diagnosis Date  . DM   . GERD   . HTN (hypertension)     Normal Cath 09/2012  . Obesity   . Hyperlipidemia   . Nephrolithiasis   . Vitamin D deficiency   . Neuropathy due to secondary diabetes mellitus [facility])    Health Maintenance  Topic Date Due  . Hepatitis C Screening  08/30/1945  . HEMOGLOBIN A1C  09/10/2015  . FOOT EXAM  11/26/2015  . INFLUENZA VACCINE  02/11/2016  . OPHTHALMOLOGY EXAM  04/08/2016  . COLONOSCOPY  07/13/2021  . TETANUS/TDAP  08/07/2024  . ZOSTAVAX  Completed  . PNA vac Low Risk Adult  Completed   Immunization History  Administered Date(s) Administered  . DT 08/07/2014  . Influenza, High Dose Seasonal PF 05/08/2013, 05/21/2015  . Pneumococcal Conjugate-13 06/22/2014  . Pneumococcal Polysaccharide-23  05/08/2013  . Td 07/13/2001  . Zoster 06/13/2013   Past Surgical History  Procedure Laterality Date  . Back surgery    . Carpel tunnal    . Elbow surgery     Family History  Problem Relation Age of Onset  . Heart disease Brother     Valve surgery  . Heart disease Brother   . COPD Mother   . Heart disease Mother   . Heart disease Father   . Cancer Father     pancreas  . Colon cancer Paternal Grandfather    Social History   Social History  . Marital Status: Married    Spouse Name: N/A  . Number of Children: 1  . 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 Smoking status: Never Smoker   . Smokeless tobacco: Never Used  . Alcohol Use: No     Comment: Rare  . Drug Use: No  . Sexual Activity: Not on file   Other Topics Concern  . Not on file   Social History Narrative    ROS Constitutional: Denies fever, chills, weight loss/gain, headaches, insomnia,  night sweats or change in appetite. Does c/o fatigue. Eyes: Denies redness, blurred vision, diplopia, discharge, itchy or watery eyes.  ENT: Denies discharge, congestion, post nasal drip, epistaxis, sore throat, earache, hearing loss, dental pain, Tinnitus, Vertigo, Sinus pain or snoring.  Cardio: Denies chest pain, palpitations, irregular heartbeat, syncope, dyspnea, diaphoresis, orthopnea, PND, claudication or edema Respiratory: denies cough, dyspnea, DOE, pleurisy, hoarseness, laryngitis or wheezing.  Gastrointestinal: Denies dysphagia, heartburn, reflux, water brash, pain, cramps, nausea, vomiting, bloating, diarrhea, constipation, hematemesis, melena, hematochezia, jaundice or hemorrhoids Genitourinary: Denies dysuria, frequency, urgency, nocturia, hesitancy, discharge, hematuria or flank pain Musculoskeletal: Denies arthralgia, myalgia, stiffness, Jt. Swelling, pain, limp or strain/sprain. Denies Falls. Skin: Denies puritis, rash, hives, warts, acne, eczema or  change in skin lesion Neuro: No weakness, tremor, incoordination, spasms, paresthesia or pain Psychiatric: Denies confusion, memory loss or sensory loss. Denies Depression. Endocrine: Denies change in weight, skin, hair change, nocturia, and paresthesia, diabetic polys, visual blurring or hyper / hypo glycemic episodes.  Heme/Lymph: No excessive bleeding, bruising or enlarged lymph nodes.  Physical Exam  BP 176/80 reck'd 144/76 Pulse 60  Temp 97 F   Resp 16  Ht 5\' 8"   Wt 217 lb 3.2 oz     BMI 32.77  General Appearance: Over nourished & morbidly obese, in no apparent distress.  Eyes: PERRLA, EOMs, conjunctiva no swelling or erythema, normal fundi and vessels. Sinuses: No frontal/maxillary  tenderness ENT/Mouth: EACs patent / TMs  nl. Nares clear without erythema, swelling, mucoid exudates. Oral hygiene is good. No erythema, swelling, or exudate. Tongue normal, non-obstructing. Tonsils not swollen or erythematous. Hearing normal.  Neck: Supple, thyroid normal. No bruits, nodes or JVD. Respiratory: Respiratory effort normal.  BS equal and clear bilateral without rales, rhonci, wheezing or stridor. Cardio: Heart sounds are normal with regular rate and rhythm and no murmurs, rubs or gallops. Peripheral pulses are normal and equal bilaterally without edema. No aortic or femoral bruits. Chest: symmetric with normal excursions and percussion.  Abdomen: Flat, soft, with bowl sounds. Nontender, no guarding, rebound, hernias, masses, or organomegaly.  Lymphatics: Non tender without lymphadenopathy.  Genitourinary: No hernias.Testes nl. DRE - prostate nl for age - smooth & firm w/o nodules. Musculoskeletal: Full ROM all peripheral extremities, joint stability, 5/5 strength, and normal gait. Skin: Warm and dry without rashes, lesions, cyanosis, clubbing or  ecchymosis.  Neuro: Cranial nerves intact, reflexes equal bilaterally. Normal muscle tone, no cerebellar symptoms. Sensation intact.  Pysch:  Alert and oriented X 3 with normal affect, insight and judgment appropriate.   Assessment and Plan  1. Annual Preventative/Screening Exam    2. Essential hypertension  - EKG 12-Lead - [place], RETROPERITNL ABD,  LTD - TSH  3. Hyperlipidemia  - Lipid panel - TSH  4. Controlled type 2 diabetes mellitus with diabetic nephropathy (HCC)  - Microalbumin / creatinine urine ratio - [facility] DIABETES FOOT EXAM - LOW EXTREMITY NEUR EXAM DOCUM - Hemoglobin A1c - Insulin, random  5. Vitamin D deficiency  - VITAMIN D 25 Hydroxy  6. Diabetic peripheral neuropathy ([place])   7. Gastroesophageal reflux disease   8. Depression, controlled   9. Medication management  - Urinalysis, Routine w reflex microscopic  - CBC with Differential/Platelet - BASIC METABOLIC PANEL WITH GFR - Hepatic function panel - Magnesium  10. Screening for rectal cancer  - POC Hemoccult Bld/Stl   11. Prostate cancer screening  - PSA   Continue prudent diet as discussed, weight control, BP monitoring, regular exercise, and medications as discussed.  Discussed med effects and SE's. Routine screening labs and tests as requested with regular follow-up as recommended. Over 40 minutes of exam, counseling, chart review and high complex critical decision making was performed

## 2015-07-09 NOTE — Patient Instructions (Signed)
Recommend Adult Low Dose Aspirin or   coated  Aspirin 81 mg daily   To reduce risk of Colon Cancer 20 %,   Skin Cancer 26 % ,   Melanoma 46%   and   Pancreatic cancer 60%   ++++++++++++++++++++++++++++++++++++++++++++++++++++++  Vitamin D goal   is between 70-100.   Please make sure that you are taking your Vitamin D as directed.   It is very important as a natural anti-inflammatory   helping hair, skin, and nails, as well as reducing stroke and heart attack risk.   It helps your bones and helps with mood.  It also decreases numerous cancer risks so please take it as directed.   Low Vit D is associated with a 200-300% higher risk for CANCER   and 200-300% higher risk for HEART   ATTACK  &  STROKE.   ......................................  It is also associated with higher death rate at younger ages,   autoimmune diseases like Rheumatoid arthritis, Lupus, Multiple Sclerosis.     Also many other serious conditions, like depression, Alzheimer's  Dementia, infertility, muscle aches, fatigue, fibromyalgia - just to name a few.  ++++++++++++++++++++++++++++++++++++++++++++++++  Recommend the book "The END of DIETING" by Dr Joel Fuhrman   & the book "The END of DIABETES " by Dr Joel Fuhrman  At [employer] - get book & Audio CD's     Being diabetic has a  300% increased risk for heart attack, stroke, cancer, and alzheimer- type vascular dementia. It is very important that you work harder with diet by avoiding all foods that are white. Avoid white rice (brown & wild rice is OK), white potatoes (sweetpotatoes in moderation is OK), White bread or wheat bread or anything made out of white flour like bagels, donuts, rolls, buns, biscuits, cakes, pastries, cookies, pizza crust, and pasta (made from white flour & egg whites) - vegetarian pasta or spinach or wheat pasta is OK. Multigrain breads like Arnold's or [employer], or multigrain sandwich thins or flatbreads.  Diet,  exercise and weight loss can reverse and cure diabetes in the early stages.  Diet, exercise and weight loss is very important in the control and prevention of complications of diabetes which affects every system in your body, ie. Brain - dementia/stroke, eyes - glaucoma/blindness, heart - heart attack/heart failure, kidneys - dialysis, stomach - gastric paralysis, intestines - malabsorption, nerves - severe painful neuritis, circulation - gangrene & loss of a leg(s), and finally cancer and Alzheimers.    I recommend avoid fried & greasy foods,  sweets/candy, white rice (brown or wild rice or Quinoa is OK), white potatoes (sweet potatoes are OK) - anything made from white flour - bagels, doughnuts, rolls, buns, biscuits,white and wheat breads, pizza crust and traditional pasta made of white flour & egg white(vegetarian pasta or spinach or wheat pasta is OK).  Multi-grain bread is OK - like multi-grain flat bread or sandwich thins. Avoid alcohol in excess. Exercise is also important.    Eat all the vegetables you want - avoid meat, especially red meat and dairy - especially cheese.  Cheese is the most concentrated form of trans-fats which is the worst thing to clog up our arteries. Veggie cheese is OK which can be found in the fresh produce section at [employer] or [employer] or [employer]  ++++++++++++++++++++++++++++++++++++++++++++++++++ DASH Eating Plan  DASH stands for "Dietary Approaches to Stop Hypertension."   The DASH eating plan is a healthy eating plan that has been shown to reduce high   blood pressure (hypertension). Additional health benefits may include reducing the risk of type 2 diabetes mellitus, heart disease, and stroke. The DASH eating plan may also help with weight loss.  WHAT DO I NEED TO KNOW ABOUT THE DASH EATING PLAN? For the DASH eating plan, you will follow these general guidelines:  Choose foods with a percent daily value for sodium of less than 5% (as listed on the food  label).  Use salt-free seasonings or herbs instead of table salt or sea salt.  Check with your health care provider or pharmacist before using salt substitutes.  Eat lower-sodium products, often labeled as "lower sodium" or "no salt added."  Eat fresh foods.  Eat more vegetables, fruits, and low-fat dairy products.    Choose whole grains. Look for the word "whole" as the first word in the ingredient list.  Choose fish   Limit sweets, desserts, sugars, and sugary drinks.  Choose heart-healthy fats.  Eat veggie cheese   Eat more home-cooked food and less restaurant, buffet, and fast food.  Limit fried foods.  Cook foods using methods other than frying.  Limit canned vegetables. If you do use them, rinse them well to decrease the sodium.  When eating at a restaurant, ask that your food be prepared with less salt, or no salt if possible.                      WHAT FOODS CAN I EAT?  Seek help from a dietitian for individual calorie needs.  Grains Whole grain or whole wheat bread. Brown rice. Whole grain or whole wheat pasta. Quinoa, bulgur, and whole grain cereals. Low-sodium cereals. Corn or whole wheat flour tortillas. Whole grain cornbread. Whole grain crackers. Low-sodium crackers.  Vegetables Fresh or frozen vegetables (raw, steamed, roasted, or grilled). Low-sodium or reduced-sodium tomato and vegetable juices. Low-sodium or reduced-sodium tomato sauce and paste. Low-sodium or reduced-sodium canned vegetables.   Fruits All fresh, canned (in natural juice), or frozen fruits.  Protein Products  All fish and seafood.  Dried beans, peas, or lentils. Unsalted nuts and seeds. Unsalted canned beans.  Dairy Low-fat dairy products, such as skim or 1% milk, 2% or reduced-fat cheeses, low-fat ricotta or cottage cheese, or plain low-fat yogurt. Low-sodium or reduced-sodium cheeses.  Fats and Oils Tub margarines without trans fats. Light or reduced-fat mayonnaise and salad  dressings (reduced sodium). Avocado. Safflower, olive, or canola oils. Natural peanut or almond butter.  Other Unsalted popcorn and pretzels. The items listed above may not be a complete list of recommended foods or beverages. Contact your dietitian for more options.  +++++++++++++++++++++++++++++++++++++++++++  WHAT FOODS ARE NOT RECOMMENDED?  Grains/ White flour or wheat flour  White bread. White pasta. White rice. Refined cornbread. Bagels and croissants. Crackers that contain trans fat.  Vegetables  Creamed or fried vegetables. Vegetables in a . Regular canned vegetables. Regular canned tomato sauce and paste. Regular tomato and vegetable juices.  Fruits Dried fruits. Canned fruit in light or heavy syrup. Fruit juice.  Meat and Other Protein Products Meat in general. Fatty cuts of meat. Ribs, chicken wings, bacon, sausage, bologna, salami, chitterlings, fatback, hot dogs, bratwurst, and packaged luncheon meats.   Preventive Care for Adults  A healthy lifestyle and preventive care can promote health and wellness. Preventive health guidelines for men include the following key practices:  A routine yearly physical is a good way to check with your health care provider about your health and preventative screening. It is a  chance to share any concerns and updates on your health and to receive a thorough exam.  Visit your dentist for a routine exam and preventative care every 6 months. Brush your teeth twice a day and floss once a day. Good oral hygiene prevents tooth decay and gum disease.  The frequency of eye exams is based on your age, health, family medical history, use of contact lenses, and other factors. Follow your health care provider's recommendations for frequency of eye exams.  Eat a healthy diet. Foods such as vegetables, fruits, whole grains, low-fat dairy products, and lean protein foods contain the nutrients you need without too many calories. Decrease your intake of  foods high in solid fats, added sugars, and salt. Eat the right amount of calories for you.Get information about a proper diet from your health care provider, if necessary.  Regular physical exercise is one of the most important things you can do for your health. Most adults should get at least 150 minutes of moderate-intensity exercise (any activity that increases your heart rate and causes you to sweat) each week. In addition, most adults need muscle-strengthening exercises on 2 or more days a week.  Maintain a healthy weight. The body mass index (BMI) is a screening tool to identify possible weight problems. It provides an estimate of body fat based on height and weight. Your health care provider can find your BMI and can help you achieve or maintain a healthy weight.For adults 20 years and older:  A BMI below 18.5 is considered underweight.  A BMI of 18.5 to 24.9 is normal.  A BMI of 25 to 29.9 is considered overweight.  A BMI of 30 and above is considered obese.  Maintain normal blood lipids and cholesterol levels by exercising and minimizing your intake of saturated fat. Eat a balanced diet with plenty of fruit and vegetables. Blood tests for lipids and cholesterol should begin at age 50 and be repeated every 5 years. If your lipid or cholesterol levels are high, you are over 50, or you are at high risk for heart disease, you may need your cholesterol levels checked more frequently.Ongoing high lipid and cholesterol levels should be treated with medicines if diet and exercise are not working.  If you smoke, find out from your health care provider how to quit. If you do not use tobacco, do not start.  Lung cancer screening is recommended for adults aged 38-80 years who are at high risk for developing lung cancer because of a history of smoking. A yearly low-dose CT scan of the lungs is recommended for people who have at least a 30-pack-year history of smoking and are a current smoker or  have quit within the past 15 years. A pack year of smoking is smoking an average of 1 pack of cigarettes a day for 1 year (for example: 1 pack a day for 30 years or 2 packs a day for 15 years). Yearly screening should continue until the smoker has stopped smoking for at least 15 years. Yearly screening should be stopped for people who develop a health problem that would prevent them from having lung cancer treatment.  If you choose to drink alcohol, do not have more than 2 drinks per day. One drink is considered to be 12 ounces (355 mL) of beer, 5 ounces (148 mL) of wine, or 1.5 ounces (44 mL) of liquor.  Avoid use of street drugs. Do not share needles with anyone. Ask for help if you need support or  instructions about stopping the use of drugs.  High blood pressure causes heart disease and increases the risk of stroke. Your blood pressure should be checked at least every 1-2 years. Ongoing high blood pressure should be treated with medicines, if weight loss and exercise are not effective.  If you are 54-72 years old, ask your health care provider if you should take aspirin to prevent heart disease.  Diabetes screening involves taking a blood sample to check your fasting blood sugar level. Testing should be considered at a younger age or be carried out more frequently if you are overweight and have at least 1 risk factor for diabetes.  Colorectal cancer can be detected and often prevented. Most routine colorectal cancer screening begins at the age of 64 and continues through age 42. However, your health care provider may recommend screening at an earlier age if you have risk factors for colon cancer. On a yearly basis, your health care provider may provide home test kits to check for hidden blood in the stool. Use of a small camera at the end of a tube to directly examine the colon (sigmoidoscopy or colonoscopy) can detect the earliest forms of colorectal cancer. Talk to your health care provider about  this at age 46, when routine screening begins. Direct exam of the colon should be repeated every 5-10 years through age 74, unless early forms of precancerous polyps or small growths are found.  Hepatitis C blood testing is recommended for all people born from [birth year] through 1965 and any individual with known risks for hepatitis C.  Screening for abdominal aortic aneurysm (AAA)  by ultrasound is recommended for people who have history of high blood pressure or who are current or former smokers.  Healthy men should  receive prostate-specific antigen (PSA) blood tests as part of routine cancer screening. Talk with your health care provider about prostate cancer screening.  Testicular cancer screening is  recommended for adult males. Screening includes self-exam, a health care provider exam, and other screening tests. Consult with your health care provider about any symptoms you have or any concerns you have about testicular cancer.  Use sunscreen. Apply sunscreen liberally and repeatedly throughout the day. You should seek shade when your shadow is shorter than you. Protect yourself by wearing long sleeves, pants, a wide-brimmed hat, and sunglasses year round, whenever you are outdoors.  Once a month, do a whole-body skin exam, using a mirror to look at the skin on your back. Tell your health care provider about new moles, moles that have irregular borders, moles that are larger than a pencil eraser, or moles that have changed in shape or color.  Stay current with required vaccines (immunizations).  Influenza vaccine. All adults should be immunized every year.  Tetanus, diphtheria, and acellular pertussis (Td, Tdap) vaccine. An adult who has not previously received Tdap or who does not know his vaccine status should receive 1 dose of Tdap. This initial dose should be followed by tetanus and diphtheria toxoids (Td) booster doses every 10 years. Adults with an unknown or incomplete history of completing  a 3-dose immunization series with Td-containing vaccines should begin or complete a primary immunization series including a Tdap dose. Adults should receive a Td booster every 10 years.  Zoster vaccine. One dose is recommended for adults aged 37 years or older unless certain conditions are present.    PREVNAR - Pneumococcal 13-valent conjugate (PCV13) vaccine. When indicated, a person who is uncertain of his immunization history and has  no record of immunization should receive the PCV13 vaccine. An adult aged 45 years or older who has certain medical conditions and has not been previously immunized should receive 1 dose of PCV13 vaccine. This PCV13 should be followed with a dose of pneumococcal polysaccharide (PPSV23) vaccine. The PPSV23 vaccine dose should be obtained at least 8 weeks after the dose of PCV13 vaccine. An adult aged 62 years or older who has certain medical conditions and previously received 1 or more doses of PPSV23 vaccine should receive 1 dose of PCV13. The PCV13 vaccine dose should be obtained 1 or more years after the last PPSV23 vaccine dose.    PNEUMOVAX - Pneumococcal polysaccharide (PPSV23) vaccine. When PCV13 is also indicated, PCV13 should be obtained first. All adults aged 15 years and older should be immunized. An adult younger than age 39 years who has certain medical conditions should be immunized. Any person who resides in a nursing home or long-term care facility should be immunized. An adult smoker should be immunized. People with an immunocompromised condition and certain other conditions should receive both PCV13 and PPSV23 vaccines. People with human immunodeficiency virus (HIV) infection should be immunized as soon as possible after diagnosis. Immunization during chemotherapy or radiation therapy should be avoided. Routine use of PPSV23 vaccine is not recommended for American Indians, [place]atives, or people younger than 65 years unless there are medical conditions  that require PPSV23 vaccine. When indicated, people who have unknown immunization and have no record of immunization should receive PPSV23 vaccine. One-time revaccination 5 years after the first dose of PPSV23 is recommended for people aged 19-64 years who have chronic kidney failure, nephrotic syndrome, asplenia, or immunocompromised conditions. People who received 1-2 doses of PPSV23 before age 44 years should receive another dose of PPSV23 vaccine at age 47 years or later if at least 5 years have passed since the previous dose. Doses of PPSV23 are not needed for people immunized with PPSV23 at or after age 34 years.    Hepatitis A vaccine. Adults who wish to be protected from this disease, have certain high-risk conditions, work with hepatitis A-infected animals, work in hepatitis A research labs, or travel to or work in countries with a high rate of hepatitis A should be immunized. Adults who were previously unvaccinated and who anticipate close contact with an international adoptee during the first 60 days after arrival in the [place] from a country with a high rate of hepatitis A should be immunized.    Hepatitis B vaccine. Adults should be immunized if they wish to be protected from this disease, have certain high-risk conditions, may be exposed to blood or other infectious body fluids, are household contacts or sex partners of hepatitis B positive people, are clients or workers in certain care facilities, or travel to or work in countries with a high rate of hepatitis B.   Preventive Service / Frequency   Ages 108 and over  Blood pressure check.  Lipid and cholesterol check.  Lung cancer screening. / Every year if you are aged 22-80 years and have a 30-pack-year history of smoking and currently smoke or have quit within the past 15 years. Yearly screening is stopped once you have quit smoking for at least 15 years or develop a health problem that would prevent you from having lung  cancer treatment.  Fecal occult blood test (FOBT) of stool. You may not have to do this test if you get a colonoscopy every 10 years.  Flexible sigmoidoscopy**  or colonoscopy.** / Every 5 years for a flexible sigmoidoscopy or every 10 years for a colonoscopy beginning at age 59 and continuing until age 75.  Hepatitis C blood test.** / For all people born from [birth year] through 1964 and any individual with known risks for hepatitis C.  Abdominal aortic aneurysm (AAA) screening./ Screening current or former smokers or have Hypertension.  Skin self-exam. / Monthly.  Influenza vaccine. / Every year.  Tetanus, diphtheria, and acellular pertussis (Tdap/Td) vaccine.** / 1 dose of Td every 10 years.   Zoster vaccine.** / 1 dose for adults aged 2 years or older.         Pneumococcal 13-valent conjugate (PCV13) vaccine.    Pneumococcal polysaccharide (PPSV23) vaccine.     Hepatitis A vaccine.** / Consult your health care provider.  Hepatitis B vaccine.** / Consult your health care provider. Screening for abdominal aortic aneurysm (AAA)  by ultrasound is recommended for people who have history of high blood pressure or who are current or former smokers.

## 2015-07-10 LAB — URINALYSIS, ROUTINE W REFLEX MICROSCOPIC
BILIRUBIN URINE: NEGATIVE
GLUCOSE, UA: NEGATIVE
HGB URINE DIPSTICK: NEGATIVE
KETONES UR: NEGATIVE
Leukocytes, UA: NEGATIVE
Nitrite: NEGATIVE
PROTEIN: NEGATIVE
Specific Gravity, Urine: 1.009 (ref 1.001–1.035)
pH: 5 (ref 5.0–8.0)

## 2015-07-10 LAB — HEMOGLOBIN A1C
HEMOGLOBIN A1C: 6.5 % — AB (ref <5.7)
Mean Plasma Glucose: 140 mg/dL — ABNORMAL HIGH (ref <117)

## 2015-07-10 LAB — MICROALBUMIN / CREATININE URINE RATIO
Creatinine, Urine: 22 mg/dL (ref 20–370)
MICROALB UR: 0.9 mg/dL
MICROALB/CREAT RATIO: 41 ug/mg{creat} — AB (ref <30)

## 2015-07-10 LAB — VITAMIN D 25 HYDROXY (VIT D DEFICIENCY, FRACTURES): VIT D 25 HYDROXY: 115 ng/mL — AB (ref 30–100)

## 2015-07-10 LAB — INSULIN, RANDOM: INSULIN: 11 u[IU]/mL (ref 2.0–19.6)

## 2015-07-10 LAB — PSA: PSA: 0.11 ng/mL (ref <=4.00)

## 2015-07-10 LAB — TSH: TSH: 2.231 u[IU]/mL (ref 0.350–4.500)

## 2015-08-01 ENCOUNTER — Other Ambulatory Visit: Payer: Self-pay | Admitting: *Deleted

## 2015-08-02 ENCOUNTER — Other Ambulatory Visit: Payer: Self-pay | Admitting: *Deleted

## 2015-08-02 MED ORDER — FINASTERIDE 5 MG PO TABS: 5.0000 mg | ORAL_TABLET | Freq: Every day | ORAL | Status: AC

## 2015-09-19 ENCOUNTER — Ambulatory Visit: Payer: Self-pay | Admitting: Physician Assistant

## 2015-10-14 ENCOUNTER — Ambulatory Visit: Payer: Self-pay | Admitting: Internal Medicine

## 2015-11-06 ENCOUNTER — Other Ambulatory Visit: Payer: Self-pay | Admitting: Physician Assistant

## 2015-12-26 ENCOUNTER — Ambulatory Visit: Payer: Self-pay | Admitting: Internal Medicine

## 2016-01-27 ENCOUNTER — Ambulatory Visit: Payer: Self-pay | Admitting: Internal Medicine

## 2016-07-24 ENCOUNTER — Encounter: Payer: Self-pay | Admitting: Internal Medicine

## 2017-02-11 ENCOUNTER — Encounter: Payer: Self-pay | Admitting: Internal Medicine

## 2018-07-14 DIAGNOSIS — Z6833 Body mass index (BMI) 33.0-33.9, adult: Secondary | ICD-10-CM | POA: Diagnosis not present

## 2018-07-14 DIAGNOSIS — L0291 Cutaneous abscess, unspecified: Secondary | ICD-10-CM | POA: Diagnosis not present

## 2018-07-14 DIAGNOSIS — F329 Major depressive disorder, single episode, unspecified: Secondary | ICD-10-CM | POA: Diagnosis not present

## 2018-07-14 DIAGNOSIS — E119 Type 2 diabetes mellitus without complications: Secondary | ICD-10-CM | POA: Diagnosis not present

## 2018-07-14 DIAGNOSIS — I10 Essential (primary) hypertension: Secondary | ICD-10-CM | POA: Diagnosis not present

## 2018-07-14 DIAGNOSIS — Z299 Encounter for prophylactic measures, unspecified: Secondary | ICD-10-CM | POA: Diagnosis not present

## 2018-07-25 DIAGNOSIS — Z299 Encounter for prophylactic measures, unspecified: Secondary | ICD-10-CM | POA: Diagnosis not present

## 2018-07-25 DIAGNOSIS — Z789 Other specified health status: Secondary | ICD-10-CM | POA: Diagnosis not present

## 2018-07-25 DIAGNOSIS — Z6834 Body mass index (BMI) 34.0-34.9, adult: Secondary | ICD-10-CM | POA: Diagnosis not present

## 2018-07-25 DIAGNOSIS — J40 Bronchitis, not specified as acute or chronic: Secondary | ICD-10-CM | POA: Diagnosis not present

## 2018-07-25 DIAGNOSIS — I10 Essential (primary) hypertension: Secondary | ICD-10-CM | POA: Diagnosis not present

## 2018-08-25 DIAGNOSIS — E1165 Type 2 diabetes mellitus with hyperglycemia: Secondary | ICD-10-CM | POA: Diagnosis not present

## 2018-08-25 DIAGNOSIS — Z789 Other specified health status: Secondary | ICD-10-CM | POA: Diagnosis not present

## 2018-08-25 DIAGNOSIS — Z6834 Body mass index (BMI) 34.0-34.9, adult: Secondary | ICD-10-CM | POA: Diagnosis not present

## 2018-08-25 DIAGNOSIS — Z299 Encounter for prophylactic measures, unspecified: Secondary | ICD-10-CM | POA: Diagnosis not present

## 2018-08-25 DIAGNOSIS — I10 Essential (primary) hypertension: Secondary | ICD-10-CM | POA: Diagnosis not present

## 2018-09-19 DIAGNOSIS — I10 Essential (primary) hypertension: Secondary | ICD-10-CM | POA: Diagnosis not present

## 2018-09-19 DIAGNOSIS — Z6834 Body mass index (BMI) 34.0-34.9, adult: Secondary | ICD-10-CM | POA: Diagnosis not present

## 2018-09-19 DIAGNOSIS — L0231 Cutaneous abscess of buttock: Secondary | ICD-10-CM | POA: Diagnosis not present

## 2018-09-19 DIAGNOSIS — Z299 Encounter for prophylactic measures, unspecified: Secondary | ICD-10-CM | POA: Diagnosis not present

## 2018-10-20 DIAGNOSIS — Z1331 Encounter for screening for depression: Secondary | ICD-10-CM | POA: Diagnosis not present

## 2018-10-20 DIAGNOSIS — E7800 Pure hypercholesterolemia, unspecified: Secondary | ICD-10-CM | POA: Diagnosis not present

## 2018-10-20 DIAGNOSIS — Z0000 Encounter for general adult medical examination without abnormal findings: Secondary | ICD-10-CM | POA: Diagnosis not present

## 2018-10-20 DIAGNOSIS — I10 Essential (primary) hypertension: Secondary | ICD-10-CM | POA: Diagnosis not present

## 2018-10-20 DIAGNOSIS — E119 Type 2 diabetes mellitus without complications: Secondary | ICD-10-CM | POA: Diagnosis not present

## 2018-10-20 DIAGNOSIS — Z6834 Body mass index (BMI) 34.0-34.9, adult: Secondary | ICD-10-CM | POA: Diagnosis not present

## 2018-10-20 DIAGNOSIS — Z79899 Other long term (current) drug therapy: Secondary | ICD-10-CM | POA: Diagnosis not present

## 2018-10-20 DIAGNOSIS — Z7189 Other specified counseling: Secondary | ICD-10-CM | POA: Diagnosis not present

## 2018-10-20 DIAGNOSIS — Z1339 Encounter for screening examination for other mental health and behavioral disorders: Secondary | ICD-10-CM | POA: Diagnosis not present

## 2018-11-29 DIAGNOSIS — E1165 Type 2 diabetes mellitus with hyperglycemia: Secondary | ICD-10-CM | POA: Diagnosis not present

## 2018-11-29 DIAGNOSIS — Z299 Encounter for prophylactic measures, unspecified: Secondary | ICD-10-CM | POA: Diagnosis not present

## 2018-11-29 DIAGNOSIS — Z713 Dietary counseling and surveillance: Secondary | ICD-10-CM | POA: Diagnosis not present

## 2018-11-29 DIAGNOSIS — I10 Essential (primary) hypertension: Secondary | ICD-10-CM | POA: Diagnosis not present

## 2018-11-29 DIAGNOSIS — Z6834 Body mass index (BMI) 34.0-34.9, adult: Secondary | ICD-10-CM | POA: Diagnosis not present

## 2018-11-29 DIAGNOSIS — R0602 Shortness of breath: Secondary | ICD-10-CM | POA: Diagnosis not present

## 2019-01-17 DIAGNOSIS — Z6835 Body mass index (BMI) 35.0-35.9, adult: Secondary | ICD-10-CM | POA: Diagnosis not present

## 2019-01-17 DIAGNOSIS — I10 Essential (primary) hypertension: Secondary | ICD-10-CM | POA: Diagnosis not present

## 2019-01-17 DIAGNOSIS — M2550 Pain in unspecified joint: Secondary | ICD-10-CM | POA: Diagnosis not present

## 2019-01-17 DIAGNOSIS — E1165 Type 2 diabetes mellitus with hyperglycemia: Secondary | ICD-10-CM | POA: Diagnosis not present

## 2019-01-17 DIAGNOSIS — Z299 Encounter for prophylactic measures, unspecified: Secondary | ICD-10-CM | POA: Diagnosis not present

## 2019-01-17 DIAGNOSIS — D509 Iron deficiency anemia, unspecified: Secondary | ICD-10-CM | POA: Diagnosis not present

## 2019-01-17 DIAGNOSIS — R5383 Other fatigue: Secondary | ICD-10-CM | POA: Diagnosis not present

## 2019-01-24 DIAGNOSIS — I10 Essential (primary) hypertension: Secondary | ICD-10-CM | POA: Diagnosis not present

## 2019-01-24 DIAGNOSIS — Z6835 Body mass index (BMI) 35.0-35.9, adult: Secondary | ICD-10-CM | POA: Diagnosis not present

## 2019-01-24 DIAGNOSIS — E7800 Pure hypercholesterolemia, unspecified: Secondary | ICD-10-CM | POA: Diagnosis not present

## 2019-01-24 DIAGNOSIS — Z299 Encounter for prophylactic measures, unspecified: Secondary | ICD-10-CM | POA: Diagnosis not present

## 2019-01-24 DIAGNOSIS — E1165 Type 2 diabetes mellitus with hyperglycemia: Secondary | ICD-10-CM | POA: Diagnosis not present

## 2019-01-24 DIAGNOSIS — D509 Iron deficiency anemia, unspecified: Secondary | ICD-10-CM | POA: Diagnosis not present

## 2019-02-07 DIAGNOSIS — Z299 Encounter for prophylactic measures, unspecified: Secondary | ICD-10-CM | POA: Diagnosis not present

## 2019-02-07 DIAGNOSIS — J45909 Unspecified asthma, uncomplicated: Secondary | ICD-10-CM | POA: Diagnosis not present

## 2019-02-07 DIAGNOSIS — E7800 Pure hypercholesterolemia, unspecified: Secondary | ICD-10-CM | POA: Diagnosis not present

## 2019-02-07 DIAGNOSIS — E1165 Type 2 diabetes mellitus with hyperglycemia: Secondary | ICD-10-CM | POA: Diagnosis not present

## 2019-02-07 DIAGNOSIS — I10 Essential (primary) hypertension: Secondary | ICD-10-CM | POA: Diagnosis not present

## 2019-02-07 DIAGNOSIS — Z6833 Body mass index (BMI) 33.0-33.9, adult: Secondary | ICD-10-CM | POA: Diagnosis not present

## 2019-02-20 DIAGNOSIS — E1165 Type 2 diabetes mellitus with hyperglycemia: Secondary | ICD-10-CM | POA: Diagnosis not present

## 2019-02-20 DIAGNOSIS — L299 Pruritus, unspecified: Secondary | ICD-10-CM | POA: Diagnosis not present

## 2019-02-20 DIAGNOSIS — K921 Melena: Secondary | ICD-10-CM | POA: Diagnosis not present

## 2019-02-20 DIAGNOSIS — Z299 Encounter for prophylactic measures, unspecified: Secondary | ICD-10-CM | POA: Diagnosis not present

## 2019-02-20 DIAGNOSIS — I10 Essential (primary) hypertension: Secondary | ICD-10-CM | POA: Diagnosis not present

## 2019-02-20 DIAGNOSIS — Z6834 Body mass index (BMI) 34.0-34.9, adult: Secondary | ICD-10-CM | POA: Diagnosis not present

## 2019-02-21 ENCOUNTER — Encounter: Payer: Self-pay | Admitting: Gastroenterology

## 2019-02-22 DIAGNOSIS — L280 Lichen simplex chronicus: Secondary | ICD-10-CM | POA: Diagnosis not present

## 2019-02-22 DIAGNOSIS — L821 Other seborrheic keratosis: Secondary | ICD-10-CM | POA: Diagnosis not present

## 2019-02-22 DIAGNOSIS — L570 Actinic keratosis: Secondary | ICD-10-CM | POA: Diagnosis not present

## 2019-02-23 DIAGNOSIS — Z299 Encounter for prophylactic measures, unspecified: Secondary | ICD-10-CM | POA: Diagnosis not present

## 2019-02-23 DIAGNOSIS — L02222 Furuncle of back [any part, except buttock]: Secondary | ICD-10-CM | POA: Diagnosis not present

## 2019-02-23 DIAGNOSIS — B029 Zoster without complications: Secondary | ICD-10-CM | POA: Diagnosis not present

## 2019-02-23 DIAGNOSIS — Z6834 Body mass index (BMI) 34.0-34.9, adult: Secondary | ICD-10-CM | POA: Diagnosis not present

## 2019-02-23 DIAGNOSIS — E1165 Type 2 diabetes mellitus with hyperglycemia: Secondary | ICD-10-CM | POA: Diagnosis not present

## 2019-02-23 DIAGNOSIS — I10 Essential (primary) hypertension: Secondary | ICD-10-CM | POA: Diagnosis not present

## 2019-03-02 ENCOUNTER — Encounter: Payer: Self-pay | Admitting: Gastroenterology

## 2019-03-07 DIAGNOSIS — I10 Essential (primary) hypertension: Secondary | ICD-10-CM | POA: Diagnosis not present

## 2019-03-07 DIAGNOSIS — Z299 Encounter for prophylactic measures, unspecified: Secondary | ICD-10-CM | POA: Diagnosis not present

## 2019-03-07 DIAGNOSIS — Z6833 Body mass index (BMI) 33.0-33.9, adult: Secondary | ICD-10-CM | POA: Diagnosis not present

## 2019-03-07 DIAGNOSIS — E1165 Type 2 diabetes mellitus with hyperglycemia: Secondary | ICD-10-CM | POA: Diagnosis not present

## 2019-03-29 ENCOUNTER — Ambulatory Visit: Payer: Medicare HMO | Admitting: Gastroenterology

## 2019-03-29 ENCOUNTER — Encounter

## 2019-03-29 ENCOUNTER — Encounter: Payer: Self-pay | Admitting: Gastroenterology

## 2019-03-29 VITALS — BP 104/64 | HR 90 | Temp 97.9°F | Ht 67.5 in | Wt 210.0 lb

## 2019-03-29 DIAGNOSIS — D509 Iron deficiency anemia, unspecified: Secondary | ICD-10-CM

## 2019-03-29 DIAGNOSIS — K625 Hemorrhage of anus and rectum: Secondary | ICD-10-CM

## 2019-03-29 NOTE — Patient Instructions (Addendum)
If you are age 72 or older, your body mass index should be between 23-30. Your Body mass index is 32.41 kg/m. If this is out of the aforementioned range listed, please consider follow up with your Primary Care Provider.  If you are age 84 or younger, your body mass index should be between 19-25. Your Body mass index is 32.41 kg/m. If this is out of the aformentioned range listed, please consider follow up with your Primary Care Provider.    NO iron supplements for 5 days prior to procedure.   You have been scheduled for an endoscopy and colonoscopy. Please follow the written instructions given to you at your visit today. Please pick up your prep supplies at the pharmacy within the next 1-3 days. If you use inhalers (even only as needed), please bring them with you on the day of your procedure. Your physician has requested that you go to www.[email or web address] and enter the access code given to you at your visit today. This web site gives a general overview about your procedure. However, you should still follow specific instructions given to you by our office regarding your preparation for the procedure.  It was a pleasure to see you today!  Dr. Loletha Carrow

## 2019-03-29 NOTE — Progress Notes (Signed)
[facility] Consult Note:  History: Timothy Boyer 03/29/2019  Referring provider: Nicanor Bake, Iva Presance [facility])  Reason for consult/chief complaint: Blood In Stools (haven't seen it in 2 weeks. [facility] PCP [place] in [place] )   Subjective  HPI:  This is a very pleasant 73 year old man referred by primary care in [facility] for recent rectal bleeding and anemia. Last month he had 1 day of bloody stool without abdominal pain.  It has not recurred since, he does not recall any clear triggers and he had no travel or sick contacts.  He does not recall having anything like this previously.  Keland does not have chronic digestive symptoms such as dysphagia, nausea, vomiting, weight loss, altered bowel habits or chronic abdominal pain.  Seen by Dr. Deatra Ina August 2011 for history of GERD, upper endoscopy in 2003 with no Barrett's esophagus.  Colonoscopy in 2003 without polyps.  Dr. Kelby Fam plan was for a routine screening colonoscopy in 2013, but the patient does not appear to have had any procedures with this practice since then.  There seems to been some clinical nursing note in March 2016 in preparation of colonoscopy, but no procedure report.  Artan also notes that several months ago primary care indicated that his "kidney function was off" and that he believes they told him it was somewhat better on a recent check. It also sounds of his diabetes may not be under optimal control based on his description of glucose levels.  ROS:  Review of Systems  Constitutional: Negative for appetite change and unexpected weight change.  HENT: Negative for mouth sores and voice change.   Eyes: Negative for pain and redness.  Respiratory: Negative for cough and shortness of breath.   Cardiovascular: Negative for chest pain and palpitations.  Genitourinary: Negative for dysuria and hematuria.  Musculoskeletal: Negative for arthralgias and myalgias.       He has had several  months of some intermittent right hip pain that might radiate down to his right groin him and says he has brought this to the attention of primary care.  Skin: Negative for pallor and rash.  Neurological: Negative for weakness and headaches.  Hematological: Negative for adenopathy.     Past Medical History: Past Medical History:  Diagnosis Date  . DM   . GERD   . HTN (hypertension)    Normal Cath 09/2012  . Hyperlipidemia   . Nephrolithiasis   . Neuropathy due to secondary diabetes mellitus ([place])   . Obesity   . Vitamin D deficiency      Past Surgical History: Past Surgical History:  Procedure Laterality Date  . BACK SURGERY    . Carpel Tunnal    . ELBOW SURGERY       Family History: Family History  Problem Relation Age of Onset  . COPD Mother   . Heart disease Mother   . Heart disease Father   . Pancreatic cancer Father   . Heart disease Brother        Valve surgery  . Heart disease Brother   . Colon cancer Paternal Grandfather   . Colon cancer Paternal Uncle   . Prostate cancer Paternal Uncle   . Esophageal cancer Neg Hx     Social History: Social History   Socioeconomic History  . Marital status: Married    Spouse name: Not on file  . Number of children: 1  . Years of education: Not on file  . Highest education level: Not on file  Occupational History    Comment: Dealer  Social Needs  . Financial resource strain: Not on file  . Food insecurity    Worry: Not on file    Inability: Not on file  . Transportation needs    Medical: Not on file    Non-medical: Not on file  Tobacco Use  . Smoking status: Never Smoker  . Smokeless tobacco: Never Used  Substance and Sexual Activity  . Alcohol use: Yes    Alcohol/week: 0.0 standard drinks    Comment: rarely  . Drug use: No  . Sexual activity: Not on file  Lifestyle  . Physical activity    Days per week: Not on file    Minutes per session: Not on file  . Stress: Not on file  Relationships  .  Social Herbalist on phone: Not on file    Gets together: Not on file    Attends religious service: Not on file    Active member of club or organization: Not on file    Attends meetings of clubs or organizations: Not on file    Relationship status: Not on file  Other Topics Concern  . Not on file  Social History Narrative  . Not on file    Allergies: Allergies  Allergen Reactions  . Augmentin [Amoxicillin-Pot Clavulanate]   . Lipitor [Atorvastatin]     tremor  . Lopid [Gemfibrozil]   . Penicillins   . Singulair [Montelukast Sodium]     Outpatient Meds: Current Outpatient Medications  Medication Sig Dispense Refill  . albuterol (PROAIR HFA) 108 (90 BASE) MCG/ACT inhaler Inhale 1-2 puffs into the lungs every 4 (four) hours as needed for wheezing or shortness of breath. 3 Inhaler 99  . ASPIRIN 81 PO Take 1 tablet by mouth daily.    . citalopram (CELEXA) 40 MG tablet Take 40 mg by mouth daily.    . cloNIDine (CATAPRES) 0.2 MG tablet TAKE 1 TABLET EVERY DAY 90 tablet 1  . ezetimibe (ZETIA) 10 MG tablet TAKE 1 TABLET EVERY DAY FOR CHOLESTEROL 90 tablet 3  . Ferrous Sulfate (IRON SUPPLEMENT PO) Take 65 mg by mouth 2 (two) times daily.    . finasteride (PROSCAR) 5 MG tablet Take 1 tablet (5 mg total) by mouth daily. 90 tablet 0  . Fluticasone-Salmeterol (ADVAIR) 100-50 MCG/DOSE AEPB Inhale 1 puff into the lungs as needed.     . furosemide (LASIX) 40 MG tablet Take 1 tablet (40 mg total) by mouth daily. For BP & Fluid 90 tablet 4  . gabapentin (NEURONTIN) 300 MG capsule TAKE 2 CAPSULES EVERY MORNING  AND TAKE 1 CAPSULE EVERY EVENING (Patient taking differently: Take by mouth. TAKE 2 CAPSULES EVERY MORNING  AND TAKE 1 CAPSULE EVERY EVENING) 270 capsule 1  . metFORMIN (GLUCOPHAGE) 1000 MG tablet Take 1 tablet (1,000 mg total) by mouth 2 (two) times daily with a meal. 180 tablet 3  . metoprolol tartrate (LOPRESSOR) 100 MG tablet Take 100 mg by mouth daily.    . minoxidil  (LONITEN) 10 MG tablet TAKE 1 TABLET EVERY DAY 90 tablet 3  . omeprazole (PRILOSEC) 20 MG capsule Take 1 capsule (20 mg total) by mouth daily. 90 capsule 3  . potassium chloride SA (K-DUR,KLOR-CON) 20 MEQ tablet Take 1 tablet (20 mEq total) by mouth 2 (two) times daily. 180 tablet 3  . VITAMIN D, CHOLECALCIFEROL, PO Take 1 tablet by mouth daily.    . benazepril-hydrochlorthiazide (LOTENSIN HCT) 20-12.5 MG per tablet Take  2 tablets by mouth daily. Take 2 tablets daily for BP (Patient taking differently: Take 1 tablet by mouth 2 (two) times daily. Take 2 tablets daily for BP) 180 tablet 3   No current facility-administered medications for this visit.       ___________________________________________________________________ Objective   Exam:  BP 104/64   Pulse 90   Temp 97.9 F (36.6 C)   Ht 5' 7.5" (1.715 m) Comment: with shoes  Wt 210 lb (95.3 kg)   BMI 32.41 kg/m    General: Pleasant, conversational, obese, ambulatory  Eyes: sclera anicteric, no redness  ENT: oral mucosa moist without lesions, no cervical or supraclavicular lymphadenopathy  CV: RRR without murmur, S1/S2, no JVD, no peripheral edema  Resp: clear to auscultation bilaterally, normal RR and effort noted  GI: soft, no tenderness, with active bowel sounds. No guarding or palpable organomegaly noted but limited by body habitus.  Skin; warm and dry, no rash or jaundice noted  Neuro: awake, alert and oriented x 3. Normal gross motor function and fluent speech  Labs:  Primary care labs from 02/20/2019:  WBC 5.4, hemoglobin 10.8, MCV 78, platelets 235  No other labs available, no imaging studies  Assessment: Encounter Diagnoses  Name Primary?  . Rectal bleeding Yes  . Iron deficiency anemia, unspecified iron deficiency anemia type     Recent single episode of self-limited lower GI bleeding. Microcytic anemia that is most likely iron deficient.  It is unclear how or if that is related to this recent  bleeding or if there may be an additional source of GI blood loss.  I do not have complete recent primary care labs, but wonder if he may have chronic kidney disease, which would also account for his anemia.  Plan:  Colonoscopy and upper endoscopy to search for source of recent bleeding and source of iron deficiency.  He is agreeable after discussion of procedure and risks.  The benefits and risks of the planned procedure were described in detail with the patient or (when appropriate) their health care proxy.  Risks were outlined as including, but not limited to, bleeding, infection, perforation, adverse medication reaction leading to cardiac or pulmonary decompensation.  The limitation of incomplete mucosal visualization was also discussed.  No guarantees or warranties were given.   Thank you for the courtesy of this consult.  Please call me with any questions or concerns.  Nelida Meuse III  CC: Referring provider noted above

## 2019-04-06 DIAGNOSIS — H52222 Regular astigmatism, left eye: Secondary | ICD-10-CM | POA: Diagnosis not present

## 2019-04-06 DIAGNOSIS — H25093 Other age-related incipient cataract, bilateral: Secondary | ICD-10-CM | POA: Diagnosis not present

## 2019-04-06 DIAGNOSIS — H5203 Hypermetropia, bilateral: Secondary | ICD-10-CM | POA: Diagnosis not present

## 2019-04-06 DIAGNOSIS — Z7984 Long term (current) use of oral hypoglycemic drugs: Secondary | ICD-10-CM | POA: Diagnosis not present

## 2019-04-06 DIAGNOSIS — H11003 Unspecified pterygium of eye, bilateral: Secondary | ICD-10-CM | POA: Diagnosis not present

## 2019-04-06 DIAGNOSIS — E119 Type 2 diabetes mellitus without complications: Secondary | ICD-10-CM | POA: Diagnosis not present

## 2019-04-06 DIAGNOSIS — H524 Presbyopia: Secondary | ICD-10-CM | POA: Diagnosis not present

## 2019-04-06 DIAGNOSIS — Z961 Presence of intraocular lens: Secondary | ICD-10-CM | POA: Diagnosis not present

## 2019-04-06 DIAGNOSIS — I10 Essential (primary) hypertension: Secondary | ICD-10-CM | POA: Diagnosis not present

## 2019-05-08 ENCOUNTER — Encounter: Payer: Self-pay | Admitting: Gastroenterology

## 2019-05-09 ENCOUNTER — Telehealth: Payer: Self-pay

## 2019-05-09 NOTE — Telephone Encounter (Signed)
Covid-19 screening questions   Do you now or have you had a fever in the last 14 days? NO   Do you have any respiratory symptoms of shortness of breath or cough now or in the last 14 days? NO  Do you have any family members or close contacts with diagnosed or suspected Covid-19 in the past 14 days? NO  Have you been tested for Covid-19 and found to be positive? NO        

## 2019-05-10 ENCOUNTER — Other Ambulatory Visit: Payer: Self-pay

## 2019-05-10 ENCOUNTER — Encounter: Payer: Self-pay | Admitting: Gastroenterology

## 2019-05-10 ENCOUNTER — Ambulatory Visit (AMBULATORY_SURGERY_CENTER): Payer: Medicare HMO | Admitting: Gastroenterology

## 2019-05-10 VITALS — BP 117/56 | HR 69 | Temp 98.0°F | Resp 15

## 2019-05-10 DIAGNOSIS — K625 Hemorrhage of anus and rectum: Secondary | ICD-10-CM

## 2019-05-10 DIAGNOSIS — D508 Other iron deficiency anemias: Secondary | ICD-10-CM

## 2019-05-10 DIAGNOSIS — K640 First degree hemorrhoids: Secondary | ICD-10-CM | POA: Diagnosis not present

## 2019-05-10 DIAGNOSIS — D122 Benign neoplasm of ascending colon: Secondary | ICD-10-CM

## 2019-05-10 DIAGNOSIS — D509 Iron deficiency anemia, unspecified: Secondary | ICD-10-CM | POA: Diagnosis not present

## 2019-05-10 MED ORDER — SODIUM CHLORIDE 0.9 % IV SOLN: 500.0000 mL | Freq: Once | INTRAVENOUS | Status: DC

## 2019-05-10 NOTE — Op Note (Signed)
[facility] Patient Name: Timothy Boyer Procedure Date: 05/10/2019 10:55 AM MRN: [id number] Endoscopist: [place]. Loletha Carrow , MD Age: 73 Referring MD:  Date of Birth: [birth year] Gender: Male Account #: [id number] Procedure:                Upper GI endoscopy Indications:              Unexplained iron deficiency anemia Medicines:                Monitored Anesthesia Care Procedure:                Pre-Anesthesia Assessment:                           - Prior to the procedure, a History and Physical                            was performed, and patient medications and                            allergies were reviewed. The patient's tolerance of                            previous anesthesia was also reviewed. The risks                            and benefits of the procedure and the sedation                            options and risks were discussed with the patient.                            All questions were answered, and informed consent                            was obtained. Prior Anticoagulants: The patient has                            taken no previous anticoagulant or antiplatelet                            agents except for aspirin. ASA Grade Assessment: II                            - A patient with mild systemic disease. After                            reviewing the risks and benefits, the patient was                            deemed in satisfactory condition to undergo the                            procedure.  After obtaining informed consent, the endoscope was                            passed under direct vision. Throughout the                            procedure, the patient's blood pressure, pulse, and                            oxygen saturations were monitored continuously. The                            Endoscope was introduced through the mouth, and                            advanced to the second part of duodenum. The upper                           GI endoscopy was accomplished without difficulty.                            The patient tolerated the procedure well. Scope In: Scope Out: Findings:                 The larynx was normal.                           The esophagus was normal.                           A few sessile fundic gland polyps were found in the                            gastric body.                           The exam of the stomach was otherwise normal.                           The cardia and gastric fundus were normal on                            retroflexion.                           The examined duodenum was normal. Complications:            No immediate complications. Estimated Blood Loss:     Estimated blood loss: none. Impression:               - Normal larynx.                           - Normal esophagus.                           - A few fundic gland polyps.                           -  Normal examined duodenum.                           - No specimens collected. Recommendation:           - Patient has a contact number available for                            emergencies. The signs and symptoms of potential                            delayed complications were discussed with the                            patient. Return to normal activities tomorrow.                            Written discharge instructions were provided to the                            patient.                           - Resume previous diet.                           - Continue present medications.                           - See the other procedure note for documentation of                            additional recommendations. Timothy Boyer L. Loletha Carrow, MD 05/10/2019 11:33:44 AM This report has been signed electronically.

## 2019-05-10 NOTE — Progress Notes (Signed)
Called to room to assist during endoscopic procedure.  Patient ID and intended procedure confirmed with present staff. Received instructions for my participation in the procedure from the performing physician.  

## 2019-05-10 NOTE — Progress Notes (Signed)
Pt's states no medical or surgical changes since previsit or office visit.  CW vitals, LC temps and PH IV.

## 2019-05-10 NOTE — Progress Notes (Signed)
PT taken to PACU. Monitors in place. VSS. Report given to RN. 

## 2019-05-10 NOTE — Op Note (Signed)
Timothy Boyer: Timothy Boyer Procedure Date: 05/10/2019 10:58 AM MRN: [id number] Endoscopist: [place]. Loletha Carrow , MD Age: 73 Referring MD:  Date of Birth: October 27, 1945 Gender: Male Account #: [id number] Procedure:                Colonoscopy Indications:              Rectal bleeding (single episode), Unexplained iron                            deficiency anemia Medicines:                Monitored Anesthesia Care Procedure:                Pre-Anesthesia Assessment:                           - Prior to the procedure, a History and Physical                            was performed, and patient medications and                            allergies were reviewed. The patient's tolerance of                            previous anesthesia was also reviewed. The risks                            and benefits of the procedure and the sedation                            options and risks were discussed with the patient.                            All questions were answered, and informed consent                            was obtained. Prior Anticoagulants: The patient has                            taken no previous anticoagulant or antiplatelet                            agents except for aspirin. ASA Grade Assessment: II                            - A patient with mild systemic disease. After                            reviewing the risks and benefits, the patient was                            deemed in satisfactory condition to undergo the  procedure.                           After obtaining informed consent, the colonoscope                            was passed under direct vision. Throughout the                            procedure, the patient's blood pressure, pulse, and                            oxygen saturations were monitored continuously. The                            Colonoscope was introduced through the anus and     advanced to the the cecum, identified by                            appendiceal orifice and ileocecal valve. The                            colonoscopy was performed without difficulty. The                            patient tolerated the procedure well. The quality                            of the bowel preparation was good. The ileocecal                            valve, appendiceal orifice, and rectum were                            photographed. Scope In: 11:05:59 AM Scope Out: 11:17:34 AM Scope Withdrawal Time: 0 hours 9 minutes 32 seconds  Total Procedure Duration: 0 hours 11 minutes 35 seconds  Findings:                 The perianal and digital rectal examinations were                            normal.                           Multiple small-mouthed diverticula were found in                            the left colon.                           A diminutive polyp was found in the ascending                            colon. The polyp was sessile. The polyp was removed  with a cold biopsy forceps. Resection and retrieval                            were complete.                           Internal hemorrhoids were found. The hemorrhoids                            were small and Grade I (internal hemorrhoids that                            do not prolapse).                           The exam was otherwise without abnormality on                            direct and retroflexion views. Complications:            No immediate complications. Estimated Blood Loss:     Estimated blood loss: none. Estimated blood loss                            was minimal. Impression:               - Diverticulosis in the left colon.                           - One diminutive polyp in the ascending colon,                            removed with a cold biopsy forceps. Resected and                            retrieved.                           - Internal hemorrhoids.                            - The examination was otherwise normal on direct                            and retroflexion views.                           Patient indicates recent blood tests may have                            revealed a kidney condition. If patient has CKD,                            that appears to be most likely explanation for IDA,                            as  no source of blood loss seen on either exam                            today. Recommendation:           - Patient has a contact number available for                            emergencies. The signs and symptoms of potential                            delayed complications were discussed with the                            patient. Return to normal activities tomorrow.                            Written discharge instructions were provided to the                            patient.                           - Resume previous diet.                           - Continue present medications.                           - Await pathology results.                           - Based on current guidelines and age, no repeat                            routine surveillance colonoscopy.                           - See the other procedure note for documentation of                            additional recommendations. Timothy Boyer L. Loletha Carrow, MD 05/10/2019 11:31:42 AM This report has been signed electronically.

## 2019-05-10 NOTE — Patient Instructions (Signed)
Discharge instructions given. Handouts on polyps,diverticulosis and hemorrhoids given. Resume previous medications. YOU HAD AN ENDOSCOPIC PROCEDURE TODAY AT [facility]:   Refer to the procedure report that was given to you for any specific questions about what was found during the examination.  If the procedure report does not answer your questions, please call your gastroenterologist to clarify.  If you requested that your care partner not be given the details of your procedure findings, then the procedure report has been included in a sealed envelope for you to review at your convenience later.  YOU SHOULD EXPECT: Some feelings of bloating in the abdomen. Passage of more gas than usual.  Walking can help get rid of the air that was put into your GI tract during the procedure and reduce the bloating. If you had a lower endoscopy (such as a colonoscopy or flexible sigmoidoscopy) you may notice spotting of blood in your stool or on the toilet paper. If you underwent a bowel prep for your procedure, you may not have a normal bowel movement for a few days.  Please Note:  You might notice some irritation and congestion in your nose or some drainage.  This is from the oxygen used during your procedure.  There is no need for concern and it should clear up in a day or so.  SYMPTOMS TO REPORT IMMEDIATELY:   Following lower endoscopy (colonoscopy or flexible sigmoidoscopy):  Excessive amounts of blood in the stool  Significant tenderness or worsening of abdominal pains  Swelling of the abdomen that is new, acute  Fever of 100F or higher   Following upper endoscopy (EGD)  Vomiting of blood or coffee ground material  New chest pain or pain under the shoulder blades  Painful or persistently difficult swallowing  New shortness of breath  Fever of 100F or higher  Black, tarry-looking stools  For urgent or emergent issues, a gastroenterologist can be reached at any hour by calling  [phone].   DIET:  We do recommend a small meal at first, but then you may proceed to your regular diet.  Drink plenty of fluids but you should avoid alcoholic beverages for 24 hours.  ACTIVITY:  You should plan to take it easy for the rest of today and you should NOT DRIVE or use heavy machinery until tomorrow (because of the sedation medicines used during the test).    FOLLOW UP: Our staff will call the number listed on your records 48-72 hours following your procedure to check on you and address any questions or concerns that you may have regarding the information given to you following your procedure. If we do not reach you, we will leave a message.  We will attempt to reach you two times.  During this call, we will ask if you have developed any symptoms of COVID 19. If you develop any symptoms (ie: fever, flu-like symptoms, shortness of breath, cough etc.) before then, please call [phone].  If you test positive for Covid 19 in the 2 weeks post procedure, please call and report this information to [place].    If any biopsies were taken you will be contacted by phone or by letter within the next 1-3 weeks.  Please call us at [phone] if you have not heard about the biopsies in 3 weeks.    SIGNATURES/CONFIDENTIALITY: You and/or your care partner have signed paperwork which will be entered into your electronic medical record.  These signatures attest to the fact that that the  information above on your After Visit Summary has been reviewed and is understood.  Full responsibility of the confidentiality of this discharge information lies with you and/or your care-partner.

## 2019-05-12 ENCOUNTER — Telehealth: Payer: Self-pay

## 2019-05-12 NOTE — Telephone Encounter (Signed)
  Follow up Call-  Call back number 05/10/2019  Post procedure Call Back phone  # [phone]  Permission to leave phone message Yes  Some recent data might be hidden     Patient questions:  Do you have a fever, pain , or abdominal swelling? No. Pain Score  0 *  Have you tolerated food without any problems? Yes.    Have you been able to return to your normal activities? Yes.    Do you have any questions about your discharge instructions: Diet   No. Medications  No. Follow up visit  No.  Do you have questions or concerns about your Care? No.  Actions: * If pain score is 4 or above: No action needed, pain <4. 1. Have you developed a fever since your procedure? no  2.   Have you had an respiratory symptoms (SOB or cough) since your procedure? no  3.   Have you tested positive for COVID 19 since your procedure no  4.   Have you had any family members/close contacts diagnosed with the COVID 19 since your procedure?  no   If yes to any of these questions please route to Joylene John, RN and Alphonsa Gin, Therapist, sports.

## 2019-05-12 NOTE — Telephone Encounter (Signed)
Phone busy and unable to leave message on follow up call.

## 2019-05-15 ENCOUNTER — Encounter: Payer: Self-pay | Admitting: Gastroenterology

## 2019-06-14 DIAGNOSIS — E1165 Type 2 diabetes mellitus with hyperglycemia: Secondary | ICD-10-CM | POA: Diagnosis not present

## 2019-06-14 DIAGNOSIS — R197 Diarrhea, unspecified: Secondary | ICD-10-CM | POA: Diagnosis not present

## 2019-06-14 DIAGNOSIS — Z299 Encounter for prophylactic measures, unspecified: Secondary | ICD-10-CM | POA: Diagnosis not present

## 2019-06-14 DIAGNOSIS — I10 Essential (primary) hypertension: Secondary | ICD-10-CM | POA: Diagnosis not present

## 2019-08-08 DIAGNOSIS — Z299 Encounter for prophylactic measures, unspecified: Secondary | ICD-10-CM | POA: Diagnosis not present

## 2019-08-08 DIAGNOSIS — E1165 Type 2 diabetes mellitus with hyperglycemia: Secondary | ICD-10-CM | POA: Diagnosis not present

## 2019-08-08 DIAGNOSIS — Z6833 Body mass index (BMI) 33.0-33.9, adult: Secondary | ICD-10-CM | POA: Diagnosis not present

## 2019-08-08 DIAGNOSIS — I10 Essential (primary) hypertension: Secondary | ICD-10-CM | POA: Diagnosis not present

## 2019-08-08 DIAGNOSIS — L0291 Cutaneous abscess, unspecified: Secondary | ICD-10-CM | POA: Diagnosis not present

## 2019-09-10 DIAGNOSIS — N400 Enlarged prostate without lower urinary tract symptoms: Secondary | ICD-10-CM | POA: Diagnosis not present

## 2019-09-10 DIAGNOSIS — F329 Major depressive disorder, single episode, unspecified: Secondary | ICD-10-CM | POA: Diagnosis not present

## 2019-09-21 DIAGNOSIS — I10 Essential (primary) hypertension: Secondary | ICD-10-CM | POA: Diagnosis not present

## 2019-09-21 DIAGNOSIS — E1165 Type 2 diabetes mellitus with hyperglycemia: Secondary | ICD-10-CM | POA: Diagnosis not present

## 2019-09-21 DIAGNOSIS — D509 Iron deficiency anemia, unspecified: Secondary | ICD-10-CM | POA: Diagnosis not present

## 2019-09-21 DIAGNOSIS — M2550 Pain in unspecified joint: Secondary | ICD-10-CM | POA: Diagnosis not present

## 2019-09-21 DIAGNOSIS — Z789 Other specified health status: Secondary | ICD-10-CM | POA: Diagnosis not present

## 2019-09-21 DIAGNOSIS — Z299 Encounter for prophylactic measures, unspecified: Secondary | ICD-10-CM | POA: Diagnosis not present

## 2019-10-27 DIAGNOSIS — Z79899 Other long term (current) drug therapy: Secondary | ICD-10-CM | POA: Diagnosis not present

## 2019-10-27 DIAGNOSIS — E1165 Type 2 diabetes mellitus with hyperglycemia: Secondary | ICD-10-CM | POA: Diagnosis not present

## 2019-10-27 DIAGNOSIS — Z125 Encounter for screening for malignant neoplasm of prostate: Secondary | ICD-10-CM | POA: Diagnosis not present

## 2019-10-27 DIAGNOSIS — Z299 Encounter for prophylactic measures, unspecified: Secondary | ICD-10-CM | POA: Diagnosis not present

## 2019-10-27 DIAGNOSIS — Z1339 Encounter for screening examination for other mental health and behavioral disorders: Secondary | ICD-10-CM | POA: Diagnosis not present

## 2019-10-27 DIAGNOSIS — E7800 Pure hypercholesterolemia, unspecified: Secondary | ICD-10-CM | POA: Diagnosis not present

## 2019-10-27 DIAGNOSIS — Z1331 Encounter for screening for depression: Secondary | ICD-10-CM | POA: Diagnosis not present

## 2019-10-27 DIAGNOSIS — Z6833 Body mass index (BMI) 33.0-33.9, adult: Secondary | ICD-10-CM | POA: Diagnosis not present

## 2019-10-27 DIAGNOSIS — Z7189 Other specified counseling: Secondary | ICD-10-CM | POA: Diagnosis not present

## 2019-10-27 DIAGNOSIS — I10 Essential (primary) hypertension: Secondary | ICD-10-CM | POA: Diagnosis not present

## 2019-10-27 DIAGNOSIS — Z1211 Encounter for screening for malignant neoplasm of colon: Secondary | ICD-10-CM | POA: Diagnosis not present

## 2019-10-27 DIAGNOSIS — R5383 Other fatigue: Secondary | ICD-10-CM | POA: Diagnosis not present

## 2019-10-27 DIAGNOSIS — Z0000 Encounter for general adult medical examination without abnormal findings: Secondary | ICD-10-CM | POA: Diagnosis not present

## 2019-12-11 DIAGNOSIS — I10 Essential (primary) hypertension: Secondary | ICD-10-CM | POA: Diagnosis not present

## 2019-12-11 DIAGNOSIS — F329 Major depressive disorder, single episode, unspecified: Secondary | ICD-10-CM | POA: Diagnosis not present

## 2020-01-09 DIAGNOSIS — I10 Essential (primary) hypertension: Secondary | ICD-10-CM | POA: Diagnosis not present

## 2020-01-09 DIAGNOSIS — E1165 Type 2 diabetes mellitus with hyperglycemia: Secondary | ICD-10-CM | POA: Diagnosis not present

## 2020-01-09 DIAGNOSIS — Z299 Encounter for prophylactic measures, unspecified: Secondary | ICD-10-CM | POA: Diagnosis not present

## 2020-02-06 DIAGNOSIS — Z203 Contact with and (suspected) exposure to rabies: Secondary | ICD-10-CM | POA: Diagnosis not present

## 2020-02-06 DIAGNOSIS — Z23 Encounter for immunization: Secondary | ICD-10-CM | POA: Diagnosis not present

## 2020-02-06 DIAGNOSIS — I10 Essential (primary) hypertension: Secondary | ICD-10-CM | POA: Diagnosis not present

## 2020-02-06 DIAGNOSIS — E119 Type 2 diabetes mellitus without complications: Secondary | ICD-10-CM | POA: Diagnosis not present

## 2020-02-09 DIAGNOSIS — E1165 Type 2 diabetes mellitus with hyperglycemia: Secondary | ICD-10-CM | POA: Diagnosis not present

## 2020-02-09 DIAGNOSIS — I10 Essential (primary) hypertension: Secondary | ICD-10-CM | POA: Diagnosis not present

## 2020-02-09 DIAGNOSIS — F329 Major depressive disorder, single episode, unspecified: Secondary | ICD-10-CM | POA: Diagnosis not present

## 2020-02-09 DIAGNOSIS — J45909 Unspecified asthma, uncomplicated: Secondary | ICD-10-CM | POA: Diagnosis not present

## 2020-04-11 DIAGNOSIS — F329 Major depressive disorder, single episode, unspecified: Secondary | ICD-10-CM | POA: Diagnosis not present

## 2020-04-11 DIAGNOSIS — E1165 Type 2 diabetes mellitus with hyperglycemia: Secondary | ICD-10-CM | POA: Diagnosis not present

## 2020-04-11 DIAGNOSIS — I10 Essential (primary) hypertension: Secondary | ICD-10-CM | POA: Diagnosis not present

## 2020-04-11 DIAGNOSIS — J45909 Unspecified asthma, uncomplicated: Secondary | ICD-10-CM | POA: Diagnosis not present

## 2020-04-17 DIAGNOSIS — D509 Iron deficiency anemia, unspecified: Secondary | ICD-10-CM | POA: Diagnosis not present

## 2020-04-17 DIAGNOSIS — Z299 Encounter for prophylactic measures, unspecified: Secondary | ICD-10-CM | POA: Diagnosis not present

## 2020-04-17 DIAGNOSIS — E1165 Type 2 diabetes mellitus with hyperglycemia: Secondary | ICD-10-CM | POA: Diagnosis not present

## 2020-04-17 DIAGNOSIS — I10 Essential (primary) hypertension: Secondary | ICD-10-CM | POA: Diagnosis not present

## 2020-06-13 DIAGNOSIS — R0789 Other chest pain: Secondary | ICD-10-CM | POA: Diagnosis not present

## 2020-06-13 DIAGNOSIS — D692 Other nonthrombocytopenic purpura: Secondary | ICD-10-CM | POA: Diagnosis not present

## 2020-06-13 DIAGNOSIS — I10 Essential (primary) hypertension: Secondary | ICD-10-CM | POA: Diagnosis not present

## 2020-06-13 DIAGNOSIS — F32A Depression, unspecified: Secondary | ICD-10-CM | POA: Diagnosis not present

## 2020-06-13 DIAGNOSIS — E1165 Type 2 diabetes mellitus with hyperglycemia: Secondary | ICD-10-CM | POA: Diagnosis not present

## 2020-06-13 DIAGNOSIS — R079 Chest pain, unspecified: Secondary | ICD-10-CM | POA: Diagnosis not present

## 2020-06-13 DIAGNOSIS — Z299 Encounter for prophylactic measures, unspecified: Secondary | ICD-10-CM | POA: Diagnosis not present

## 2020-06-17 DIAGNOSIS — E1165 Type 2 diabetes mellitus with hyperglycemia: Secondary | ICD-10-CM | POA: Diagnosis not present

## 2020-06-17 DIAGNOSIS — R0789 Other chest pain: Secondary | ICD-10-CM | POA: Diagnosis not present

## 2020-06-17 DIAGNOSIS — E119 Type 2 diabetes mellitus without complications: Secondary | ICD-10-CM | POA: Diagnosis not present

## 2020-06-17 DIAGNOSIS — I700 Atherosclerosis of aorta: Secondary | ICD-10-CM | POA: Diagnosis not present

## 2020-06-17 DIAGNOSIS — Z299 Encounter for prophylactic measures, unspecified: Secondary | ICD-10-CM | POA: Diagnosis not present

## 2020-07-12 DIAGNOSIS — I10 Essential (primary) hypertension: Secondary | ICD-10-CM | POA: Diagnosis not present

## 2020-07-12 DIAGNOSIS — E1165 Type 2 diabetes mellitus with hyperglycemia: Secondary | ICD-10-CM | POA: Diagnosis not present

## 2020-07-12 DIAGNOSIS — N400 Enlarged prostate without lower urinary tract symptoms: Secondary | ICD-10-CM | POA: Diagnosis not present

## 2020-07-25 DIAGNOSIS — I700 Atherosclerosis of aorta: Secondary | ICD-10-CM | POA: Diagnosis not present

## 2020-07-25 DIAGNOSIS — Z789 Other specified health status: Secondary | ICD-10-CM | POA: Diagnosis not present

## 2020-07-25 DIAGNOSIS — E1165 Type 2 diabetes mellitus with hyperglycemia: Secondary | ICD-10-CM | POA: Diagnosis not present

## 2020-07-25 DIAGNOSIS — Z6832 Body mass index (BMI) 32.0-32.9, adult: Secondary | ICD-10-CM | POA: Diagnosis not present

## 2020-07-25 DIAGNOSIS — I10 Essential (primary) hypertension: Secondary | ICD-10-CM | POA: Diagnosis not present

## 2020-07-25 DIAGNOSIS — Z299 Encounter for prophylactic measures, unspecified: Secondary | ICD-10-CM | POA: Diagnosis not present

## 2020-10-30 DIAGNOSIS — Z6833 Body mass index (BMI) 33.0-33.9, adult: Secondary | ICD-10-CM | POA: Diagnosis not present

## 2020-10-30 DIAGNOSIS — Z1339 Encounter for screening examination for other mental health and behavioral disorders: Secondary | ICD-10-CM | POA: Diagnosis not present

## 2020-10-30 DIAGNOSIS — E7800 Pure hypercholesterolemia, unspecified: Secondary | ICD-10-CM | POA: Diagnosis not present

## 2020-10-30 DIAGNOSIS — Z125 Encounter for screening for malignant neoplasm of prostate: Secondary | ICD-10-CM | POA: Diagnosis not present

## 2020-10-30 DIAGNOSIS — E1165 Type 2 diabetes mellitus with hyperglycemia: Secondary | ICD-10-CM | POA: Diagnosis not present

## 2020-10-30 DIAGNOSIS — R5383 Other fatigue: Secondary | ICD-10-CM | POA: Diagnosis not present

## 2020-10-30 DIAGNOSIS — Z0000 Encounter for general adult medical examination without abnormal findings: Secondary | ICD-10-CM | POA: Diagnosis not present

## 2020-10-30 DIAGNOSIS — Z7189 Other specified counseling: Secondary | ICD-10-CM | POA: Diagnosis not present

## 2020-10-30 DIAGNOSIS — Z79899 Other long term (current) drug therapy: Secondary | ICD-10-CM | POA: Diagnosis not present

## 2020-10-30 DIAGNOSIS — Z1331 Encounter for screening for depression: Secondary | ICD-10-CM | POA: Diagnosis not present

## 2020-11-04 DIAGNOSIS — M79632 Pain in left forearm: Secondary | ICD-10-CM | POA: Diagnosis not present

## 2020-11-04 DIAGNOSIS — M19032 Primary osteoarthritis, left wrist: Secondary | ICD-10-CM | POA: Diagnosis not present

## 2020-11-04 DIAGNOSIS — M11232 Other chondrocalcinosis, left wrist: Secondary | ICD-10-CM | POA: Diagnosis not present

## 2020-11-04 DIAGNOSIS — M25532 Pain in left wrist: Secondary | ICD-10-CM | POA: Diagnosis not present

## 2021-01-28 DIAGNOSIS — M545 Low back pain, unspecified: Secondary | ICD-10-CM | POA: Diagnosis not present

## 2021-01-28 DIAGNOSIS — M2550 Pain in unspecified joint: Secondary | ICD-10-CM | POA: Diagnosis not present

## 2021-01-28 DIAGNOSIS — Z6833 Body mass index (BMI) 33.0-33.9, adult: Secondary | ICD-10-CM | POA: Diagnosis not present

## 2021-01-28 DIAGNOSIS — E1165 Type 2 diabetes mellitus with hyperglycemia: Secondary | ICD-10-CM | POA: Diagnosis not present

## 2021-01-28 DIAGNOSIS — I10 Essential (primary) hypertension: Secondary | ICD-10-CM | POA: Diagnosis not present

## 2021-01-28 DIAGNOSIS — Z299 Encounter for prophylactic measures, unspecified: Secondary | ICD-10-CM | POA: Diagnosis not present

## 2021-02-03 DIAGNOSIS — E1165 Type 2 diabetes mellitus with hyperglycemia: Secondary | ICD-10-CM | POA: Diagnosis not present

## 2021-02-03 DIAGNOSIS — Z6832 Body mass index (BMI) 32.0-32.9, adult: Secondary | ICD-10-CM | POA: Diagnosis not present

## 2021-02-03 DIAGNOSIS — I700 Atherosclerosis of aorta: Secondary | ICD-10-CM | POA: Diagnosis not present

## 2021-02-03 DIAGNOSIS — Z299 Encounter for prophylactic measures, unspecified: Secondary | ICD-10-CM | POA: Diagnosis not present

## 2021-02-03 DIAGNOSIS — I10 Essential (primary) hypertension: Secondary | ICD-10-CM | POA: Diagnosis not present

## 2021-02-03 DIAGNOSIS — F339 Major depressive disorder, recurrent, unspecified: Secondary | ICD-10-CM | POA: Diagnosis not present

## 2021-02-03 DIAGNOSIS — D692 Other nonthrombocytopenic purpura: Secondary | ICD-10-CM | POA: Diagnosis not present

## 2021-05-13 DIAGNOSIS — E1165 Type 2 diabetes mellitus with hyperglycemia: Secondary | ICD-10-CM | POA: Diagnosis not present

## 2021-05-13 DIAGNOSIS — D649 Anemia, unspecified: Secondary | ICD-10-CM | POA: Diagnosis not present

## 2021-05-13 DIAGNOSIS — Z299 Encounter for prophylactic measures, unspecified: Secondary | ICD-10-CM | POA: Diagnosis not present

## 2021-05-13 DIAGNOSIS — L570 Actinic keratosis: Secondary | ICD-10-CM | POA: Diagnosis not present

## 2021-05-13 DIAGNOSIS — I10 Essential (primary) hypertension: Secondary | ICD-10-CM | POA: Diagnosis not present

## 2021-05-13 DIAGNOSIS — Z2821 Immunization not carried out because of patient refusal: Secondary | ICD-10-CM | POA: Diagnosis not present

## 2021-06-17 DIAGNOSIS — M2550 Pain in unspecified joint: Secondary | ICD-10-CM | POA: Diagnosis not present

## 2021-07-25 DIAGNOSIS — I10 Essential (primary) hypertension: Secondary | ICD-10-CM | POA: Diagnosis not present

## 2021-07-25 DIAGNOSIS — Z299 Encounter for prophylactic measures, unspecified: Secondary | ICD-10-CM | POA: Diagnosis not present

## 2021-07-25 DIAGNOSIS — Z6833 Body mass index (BMI) 33.0-33.9, adult: Secondary | ICD-10-CM | POA: Diagnosis not present

## 2021-07-25 DIAGNOSIS — Z79899 Other long term (current) drug therapy: Secondary | ICD-10-CM | POA: Diagnosis not present

## 2021-07-25 DIAGNOSIS — M1380 Other specified arthritis, unspecified site: Secondary | ICD-10-CM | POA: Diagnosis not present

## 2021-07-25 DIAGNOSIS — M25532 Pain in left wrist: Secondary | ICD-10-CM | POA: Diagnosis not present

## 2021-07-28 DIAGNOSIS — Z111 Encounter for screening for respiratory tuberculosis: Secondary | ICD-10-CM | POA: Diagnosis not present

## 2021-07-30 DIAGNOSIS — M1380 Other specified arthritis, unspecified site: Secondary | ICD-10-CM | POA: Diagnosis not present

## 2021-07-30 DIAGNOSIS — R7989 Other specified abnormal findings of blood chemistry: Secondary | ICD-10-CM | POA: Diagnosis not present

## 2021-07-30 DIAGNOSIS — Z6833 Body mass index (BMI) 33.0-33.9, adult: Secondary | ICD-10-CM | POA: Diagnosis not present

## 2021-07-30 DIAGNOSIS — Z299 Encounter for prophylactic measures, unspecified: Secondary | ICD-10-CM | POA: Diagnosis not present

## 2021-07-30 DIAGNOSIS — I10 Essential (primary) hypertension: Secondary | ICD-10-CM | POA: Diagnosis not present

## 2021-08-11 DIAGNOSIS — M1380 Other specified arthritis, unspecified site: Secondary | ICD-10-CM | POA: Diagnosis not present

## 2021-08-21 DIAGNOSIS — I10 Essential (primary) hypertension: Secondary | ICD-10-CM | POA: Diagnosis not present

## 2021-08-21 DIAGNOSIS — I700 Atherosclerosis of aorta: Secondary | ICD-10-CM | POA: Diagnosis not present

## 2021-08-21 DIAGNOSIS — F339 Major depressive disorder, recurrent, unspecified: Secondary | ICD-10-CM | POA: Diagnosis not present

## 2021-08-21 DIAGNOSIS — E1165 Type 2 diabetes mellitus with hyperglycemia: Secondary | ICD-10-CM | POA: Diagnosis not present

## 2021-08-21 DIAGNOSIS — Z87891 Personal history of nicotine dependence: Secondary | ICD-10-CM | POA: Diagnosis not present

## 2021-08-21 DIAGNOSIS — Z299 Encounter for prophylactic measures, unspecified: Secondary | ICD-10-CM | POA: Diagnosis not present

## 2021-08-22 DIAGNOSIS — E119 Type 2 diabetes mellitus without complications: Secondary | ICD-10-CM | POA: Diagnosis not present

## 2021-08-22 DIAGNOSIS — Z299 Encounter for prophylactic measures, unspecified: Secondary | ICD-10-CM | POA: Diagnosis not present

## 2021-08-22 DIAGNOSIS — Z6832 Body mass index (BMI) 32.0-32.9, adult: Secondary | ICD-10-CM | POA: Diagnosis not present

## 2021-08-22 DIAGNOSIS — M2550 Pain in unspecified joint: Secondary | ICD-10-CM | POA: Diagnosis not present

## 2021-08-22 DIAGNOSIS — E1165 Type 2 diabetes mellitus with hyperglycemia: Secondary | ICD-10-CM | POA: Diagnosis not present

## 2021-09-01 DIAGNOSIS — M1380 Other specified arthritis, unspecified site: Secondary | ICD-10-CM | POA: Diagnosis not present

## 2021-09-08 DIAGNOSIS — Z299 Encounter for prophylactic measures, unspecified: Secondary | ICD-10-CM | POA: Diagnosis not present

## 2021-09-08 DIAGNOSIS — J320 Chronic maxillary sinusitis: Secondary | ICD-10-CM | POA: Diagnosis not present

## 2021-09-08 DIAGNOSIS — I700 Atherosclerosis of aorta: Secondary | ICD-10-CM | POA: Diagnosis not present

## 2021-09-08 DIAGNOSIS — F339 Major depressive disorder, recurrent, unspecified: Secondary | ICD-10-CM | POA: Diagnosis not present

## 2021-09-08 DIAGNOSIS — J3489 Other specified disorders of nose and nasal sinuses: Secondary | ICD-10-CM | POA: Diagnosis not present

## 2021-09-08 DIAGNOSIS — D692 Other nonthrombocytopenic purpura: Secondary | ICD-10-CM | POA: Diagnosis not present

## 2021-09-10 DIAGNOSIS — Z299 Encounter for prophylactic measures, unspecified: Secondary | ICD-10-CM | POA: Diagnosis not present

## 2021-09-10 DIAGNOSIS — Z6832 Body mass index (BMI) 32.0-32.9, adult: Secondary | ICD-10-CM | POA: Diagnosis not present

## 2021-09-10 DIAGNOSIS — E1165 Type 2 diabetes mellitus with hyperglycemia: Secondary | ICD-10-CM | POA: Diagnosis not present

## 2021-09-10 DIAGNOSIS — M7551 Bursitis of right shoulder: Secondary | ICD-10-CM | POA: Diagnosis not present

## 2021-09-10 DIAGNOSIS — M25511 Pain in right shoulder: Secondary | ICD-10-CM | POA: Diagnosis not present

## 2021-11-06 DIAGNOSIS — Z1331 Encounter for screening for depression: Secondary | ICD-10-CM | POA: Diagnosis not present

## 2021-11-06 DIAGNOSIS — R5383 Other fatigue: Secondary | ICD-10-CM | POA: Diagnosis not present

## 2021-11-06 DIAGNOSIS — G4700 Insomnia, unspecified: Secondary | ICD-10-CM | POA: Diagnosis not present

## 2021-11-06 DIAGNOSIS — I10 Essential (primary) hypertension: Secondary | ICD-10-CM | POA: Diagnosis not present

## 2021-11-06 DIAGNOSIS — Z0000 Encounter for general adult medical examination without abnormal findings: Secondary | ICD-10-CM | POA: Diagnosis not present

## 2021-11-06 DIAGNOSIS — G4730 Sleep apnea, unspecified: Secondary | ICD-10-CM | POA: Diagnosis not present

## 2021-11-06 DIAGNOSIS — Z7189 Other specified counseling: Secondary | ICD-10-CM | POA: Diagnosis not present

## 2021-11-06 DIAGNOSIS — Z1339 Encounter for screening examination for other mental health and behavioral disorders: Secondary | ICD-10-CM | POA: Diagnosis not present

## 2021-11-06 DIAGNOSIS — Z299 Encounter for prophylactic measures, unspecified: Secondary | ICD-10-CM | POA: Diagnosis not present

## 2021-11-06 DIAGNOSIS — Z6831 Body mass index (BMI) 31.0-31.9, adult: Secondary | ICD-10-CM | POA: Diagnosis not present

## 2021-11-06 DIAGNOSIS — G4733 Obstructive sleep apnea (adult) (pediatric): Secondary | ICD-10-CM | POA: Diagnosis not present

## 2021-11-06 DIAGNOSIS — Z125 Encounter for screening for malignant neoplasm of prostate: Secondary | ICD-10-CM | POA: Diagnosis not present

## 2021-11-06 DIAGNOSIS — E6609 Other obesity due to excess calories: Secondary | ICD-10-CM | POA: Diagnosis not present

## 2021-11-06 DIAGNOSIS — J069 Acute upper respiratory infection, unspecified: Secondary | ICD-10-CM | POA: Diagnosis not present

## 2021-11-06 DIAGNOSIS — Z79899 Other long term (current) drug therapy: Secondary | ICD-10-CM | POA: Diagnosis not present

## 2021-11-06 DIAGNOSIS — E7800 Pure hypercholesterolemia, unspecified: Secondary | ICD-10-CM | POA: Diagnosis not present

## 2021-11-06 DIAGNOSIS — J302 Other seasonal allergic rhinitis: Secondary | ICD-10-CM | POA: Diagnosis not present

## 2021-11-20 DIAGNOSIS — G4730 Sleep apnea, unspecified: Secondary | ICD-10-CM | POA: Diagnosis not present

## 2021-11-25 DIAGNOSIS — Z299 Encounter for prophylactic measures, unspecified: Secondary | ICD-10-CM | POA: Diagnosis not present

## 2021-11-25 DIAGNOSIS — Z789 Other specified health status: Secondary | ICD-10-CM | POA: Diagnosis not present

## 2021-11-25 DIAGNOSIS — E1165 Type 2 diabetes mellitus with hyperglycemia: Secondary | ICD-10-CM | POA: Diagnosis not present

## 2021-11-25 DIAGNOSIS — I10 Essential (primary) hypertension: Secondary | ICD-10-CM | POA: Diagnosis not present

## 2021-11-28 DIAGNOSIS — I10 Essential (primary) hypertension: Secondary | ICD-10-CM | POA: Diagnosis not present

## 2021-11-28 DIAGNOSIS — Z79899 Other long term (current) drug therapy: Secondary | ICD-10-CM | POA: Diagnosis not present

## 2021-11-28 DIAGNOSIS — M1380 Other specified arthritis, unspecified site: Secondary | ICD-10-CM | POA: Diagnosis not present

## 2021-11-28 DIAGNOSIS — Z299 Encounter for prophylactic measures, unspecified: Secondary | ICD-10-CM | POA: Diagnosis not present

## 2021-11-28 DIAGNOSIS — Z6830 Body mass index (BMI) 30.0-30.9, adult: Secondary | ICD-10-CM | POA: Diagnosis not present

## 2021-11-28 DIAGNOSIS — D84821 Immunodeficiency due to drugs: Secondary | ICD-10-CM | POA: Diagnosis not present

## 2021-12-02 DIAGNOSIS — Z299 Encounter for prophylactic measures, unspecified: Secondary | ICD-10-CM | POA: Diagnosis not present

## 2021-12-02 DIAGNOSIS — I10 Essential (primary) hypertension: Secondary | ICD-10-CM | POA: Diagnosis not present

## 2021-12-02 DIAGNOSIS — U071 COVID-19: Secondary | ICD-10-CM | POA: Diagnosis not present

## 2022-02-05 DIAGNOSIS — E119 Type 2 diabetes mellitus without complications: Secondary | ICD-10-CM | POA: Diagnosis not present

## 2022-02-05 DIAGNOSIS — H5200 Hypermetropia, unspecified eye: Secondary | ICD-10-CM | POA: Diagnosis not present

## 2022-02-05 DIAGNOSIS — H25812 Combined forms of age-related cataract, left eye: Secondary | ICD-10-CM | POA: Diagnosis not present

## 2022-02-05 DIAGNOSIS — Z0100 Encounter for examination of eyes and vision without abnormal findings: Secondary | ICD-10-CM | POA: Diagnosis not present

## 2022-02-09 DIAGNOSIS — I10 Essential (primary) hypertension: Secondary | ICD-10-CM | POA: Diagnosis not present

## 2022-02-09 DIAGNOSIS — M1380 Other specified arthritis, unspecified site: Secondary | ICD-10-CM | POA: Diagnosis not present

## 2022-02-09 DIAGNOSIS — R7989 Other specified abnormal findings of blood chemistry: Secondary | ICD-10-CM | POA: Diagnosis not present

## 2022-02-20 DIAGNOSIS — I10 Essential (primary) hypertension: Secondary | ICD-10-CM | POA: Diagnosis not present

## 2022-02-20 DIAGNOSIS — D84821 Immunodeficiency due to drugs: Secondary | ICD-10-CM | POA: Diagnosis not present

## 2022-02-20 DIAGNOSIS — Z6831 Body mass index (BMI) 31.0-31.9, adult: Secondary | ICD-10-CM | POA: Diagnosis not present

## 2022-02-20 DIAGNOSIS — Z299 Encounter for prophylactic measures, unspecified: Secondary | ICD-10-CM | POA: Diagnosis not present

## 2022-02-20 DIAGNOSIS — M1380 Other specified arthritis, unspecified site: Secondary | ICD-10-CM | POA: Diagnosis not present

## 2022-02-20 DIAGNOSIS — Z79899 Other long term (current) drug therapy: Secondary | ICD-10-CM | POA: Diagnosis not present

## 2022-03-03 DIAGNOSIS — Z299 Encounter for prophylactic measures, unspecified: Secondary | ICD-10-CM | POA: Diagnosis not present

## 2022-03-03 DIAGNOSIS — Z789 Other specified health status: Secondary | ICD-10-CM | POA: Diagnosis not present

## 2022-03-03 DIAGNOSIS — E1165 Type 2 diabetes mellitus with hyperglycemia: Secondary | ICD-10-CM | POA: Diagnosis not present

## 2022-03-03 DIAGNOSIS — J329 Chronic sinusitis, unspecified: Secondary | ICD-10-CM | POA: Diagnosis not present

## 2022-03-03 DIAGNOSIS — I10 Essential (primary) hypertension: Secondary | ICD-10-CM | POA: Diagnosis not present

## 2022-03-05 DIAGNOSIS — Z6830 Body mass index (BMI) 30.0-30.9, adult: Secondary | ICD-10-CM | POA: Diagnosis not present

## 2022-03-05 DIAGNOSIS — G4730 Sleep apnea, unspecified: Secondary | ICD-10-CM | POA: Diagnosis not present

## 2022-03-05 DIAGNOSIS — Z299 Encounter for prophylactic measures, unspecified: Secondary | ICD-10-CM | POA: Diagnosis not present

## 2022-03-05 DIAGNOSIS — I10 Essential (primary) hypertension: Secondary | ICD-10-CM | POA: Diagnosis not present

## 2022-03-05 DIAGNOSIS — E1165 Type 2 diabetes mellitus with hyperglycemia: Secondary | ICD-10-CM | POA: Diagnosis not present

## 2022-04-07 DIAGNOSIS — Z299 Encounter for prophylactic measures, unspecified: Secondary | ICD-10-CM | POA: Diagnosis not present

## 2022-04-07 DIAGNOSIS — G720 Drug-induced myopathy: Secondary | ICD-10-CM | POA: Diagnosis not present

## 2022-04-07 DIAGNOSIS — Z6830 Body mass index (BMI) 30.0-30.9, adult: Secondary | ICD-10-CM | POA: Diagnosis not present

## 2022-04-07 DIAGNOSIS — G4730 Sleep apnea, unspecified: Secondary | ICD-10-CM | POA: Diagnosis not present

## 2022-04-07 DIAGNOSIS — I700 Atherosclerosis of aorta: Secondary | ICD-10-CM | POA: Diagnosis not present

## 2022-04-07 DIAGNOSIS — I10 Essential (primary) hypertension: Secondary | ICD-10-CM | POA: Diagnosis not present

## 2022-05-11 DIAGNOSIS — M1380 Other specified arthritis, unspecified site: Secondary | ICD-10-CM | POA: Diagnosis not present

## 2022-05-13 DIAGNOSIS — I10 Essential (primary) hypertension: Secondary | ICD-10-CM | POA: Diagnosis not present

## 2022-05-13 DIAGNOSIS — G4730 Sleep apnea, unspecified: Secondary | ICD-10-CM | POA: Diagnosis not present

## 2022-05-13 DIAGNOSIS — Z299 Encounter for prophylactic measures, unspecified: Secondary | ICD-10-CM | POA: Diagnosis not present

## 2022-05-13 DIAGNOSIS — E1165 Type 2 diabetes mellitus with hyperglycemia: Secondary | ICD-10-CM | POA: Diagnosis not present

## 2022-05-13 DIAGNOSIS — I739 Peripheral vascular disease, unspecified: Secondary | ICD-10-CM | POA: Diagnosis not present

## 2022-05-18 DIAGNOSIS — I70211 Atherosclerosis of native arteries of extremities with intermittent claudication, right leg: Secondary | ICD-10-CM | POA: Diagnosis not present

## 2022-06-01 DIAGNOSIS — M542 Cervicalgia: Secondary | ICD-10-CM | POA: Diagnosis not present

## 2022-06-01 DIAGNOSIS — R197 Diarrhea, unspecified: Secondary | ICD-10-CM | POA: Diagnosis not present

## 2022-06-01 DIAGNOSIS — Z299 Encounter for prophylactic measures, unspecified: Secondary | ICD-10-CM | POA: Diagnosis not present

## 2022-06-01 DIAGNOSIS — I10 Essential (primary) hypertension: Secondary | ICD-10-CM | POA: Diagnosis not present

## 2022-06-08 DIAGNOSIS — M545 Low back pain, unspecified: Secondary | ICD-10-CM | POA: Diagnosis not present

## 2022-06-08 DIAGNOSIS — M47816 Spondylosis without myelopathy or radiculopathy, lumbar region: Secondary | ICD-10-CM | POA: Diagnosis not present

## 2022-06-08 DIAGNOSIS — M542 Cervicalgia: Secondary | ICD-10-CM | POA: Diagnosis not present

## 2022-06-08 DIAGNOSIS — R200 Anesthesia of skin: Secondary | ICD-10-CM | POA: Diagnosis not present

## 2022-06-08 DIAGNOSIS — M47812 Spondylosis without myelopathy or radiculopathy, cervical region: Secondary | ICD-10-CM | POA: Diagnosis not present

## 2022-06-09 DIAGNOSIS — E1159 Type 2 diabetes mellitus with other circulatory complications: Secondary | ICD-10-CM | POA: Diagnosis not present

## 2022-06-09 DIAGNOSIS — Z299 Encounter for prophylactic measures, unspecified: Secondary | ICD-10-CM | POA: Diagnosis not present

## 2022-06-09 DIAGNOSIS — I152 Hypertension secondary to endocrine disorders: Secondary | ICD-10-CM | POA: Diagnosis not present

## 2022-06-09 DIAGNOSIS — Z789 Other specified health status: Secondary | ICD-10-CM | POA: Diagnosis not present

## 2022-06-09 DIAGNOSIS — I10 Essential (primary) hypertension: Secondary | ICD-10-CM | POA: Diagnosis not present

## 2022-06-12 DIAGNOSIS — Z79899 Other long term (current) drug therapy: Secondary | ICD-10-CM | POA: Diagnosis not present

## 2022-06-12 DIAGNOSIS — M1380 Other specified arthritis, unspecified site: Secondary | ICD-10-CM | POA: Diagnosis not present

## 2022-06-12 DIAGNOSIS — Z299 Encounter for prophylactic measures, unspecified: Secondary | ICD-10-CM | POA: Diagnosis not present

## 2022-06-12 DIAGNOSIS — I10 Essential (primary) hypertension: Secondary | ICD-10-CM | POA: Diagnosis not present

## 2022-06-12 DIAGNOSIS — Z6831 Body mass index (BMI) 31.0-31.9, adult: Secondary | ICD-10-CM | POA: Diagnosis not present

## 2022-06-18 DIAGNOSIS — F339 Major depressive disorder, recurrent, unspecified: Secondary | ICD-10-CM | POA: Diagnosis not present

## 2022-06-18 DIAGNOSIS — L821 Other seborrheic keratosis: Secondary | ICD-10-CM | POA: Diagnosis not present

## 2022-06-18 DIAGNOSIS — I10 Essential (primary) hypertension: Secondary | ICD-10-CM | POA: Diagnosis not present

## 2022-06-18 DIAGNOSIS — D369 Benign neoplasm, unspecified site: Secondary | ICD-10-CM | POA: Diagnosis not present

## 2022-06-18 DIAGNOSIS — Z299 Encounter for prophylactic measures, unspecified: Secondary | ICD-10-CM | POA: Diagnosis not present

## 2022-06-18 DIAGNOSIS — M542 Cervicalgia: Secondary | ICD-10-CM | POA: Diagnosis not present

## 2022-06-19 DIAGNOSIS — M542 Cervicalgia: Secondary | ICD-10-CM | POA: Diagnosis not present

## 2022-06-19 DIAGNOSIS — Z299 Encounter for prophylactic measures, unspecified: Secondary | ICD-10-CM | POA: Diagnosis not present

## 2022-06-19 DIAGNOSIS — I10 Essential (primary) hypertension: Secondary | ICD-10-CM | POA: Diagnosis not present

## 2022-06-19 DIAGNOSIS — G4730 Sleep apnea, unspecified: Secondary | ICD-10-CM | POA: Diagnosis not present

## 2022-06-23 DIAGNOSIS — Z299 Encounter for prophylactic measures, unspecified: Secondary | ICD-10-CM | POA: Diagnosis not present

## 2022-06-23 DIAGNOSIS — L820 Inflamed seborrheic keratosis: Secondary | ICD-10-CM | POA: Diagnosis not present

## 2022-06-23 DIAGNOSIS — B078 Other viral warts: Secondary | ICD-10-CM | POA: Diagnosis not present

## 2022-06-23 DIAGNOSIS — D485 Neoplasm of uncertain behavior of skin: Secondary | ICD-10-CM | POA: Diagnosis not present

## 2022-06-23 DIAGNOSIS — I10 Essential (primary) hypertension: Secondary | ICD-10-CM | POA: Diagnosis not present

## 2022-07-14 DIAGNOSIS — M542 Cervicalgia: Secondary | ICD-10-CM | POA: Diagnosis not present

## 2022-07-17 DIAGNOSIS — M542 Cervicalgia: Secondary | ICD-10-CM | POA: Diagnosis not present

## 2022-07-21 DIAGNOSIS — M542 Cervicalgia: Secondary | ICD-10-CM | POA: Diagnosis not present

## 2022-07-23 DIAGNOSIS — M542 Cervicalgia: Secondary | ICD-10-CM | POA: Diagnosis not present

## 2022-07-29 DIAGNOSIS — I700 Atherosclerosis of aorta: Secondary | ICD-10-CM | POA: Diagnosis not present

## 2022-07-29 DIAGNOSIS — E1165 Type 2 diabetes mellitus with hyperglycemia: Secondary | ICD-10-CM | POA: Diagnosis not present

## 2022-07-29 DIAGNOSIS — I10 Essential (primary) hypertension: Secondary | ICD-10-CM | POA: Diagnosis not present

## 2022-07-29 DIAGNOSIS — D84821 Immunodeficiency due to drugs: Secondary | ICD-10-CM | POA: Diagnosis not present

## 2022-07-29 DIAGNOSIS — Z299 Encounter for prophylactic measures, unspecified: Secondary | ICD-10-CM | POA: Diagnosis not present

## 2022-07-29 DIAGNOSIS — L570 Actinic keratosis: Secondary | ICD-10-CM | POA: Diagnosis not present

## 2022-08-10 DIAGNOSIS — M1380 Other specified arthritis, unspecified site: Secondary | ICD-10-CM | POA: Diagnosis not present

## 2022-08-10 DIAGNOSIS — R5383 Other fatigue: Secondary | ICD-10-CM | POA: Diagnosis not present

## 2022-08-21 DIAGNOSIS — Z299 Encounter for prophylactic measures, unspecified: Secondary | ICD-10-CM | POA: Diagnosis not present

## 2022-08-21 DIAGNOSIS — M1380 Other specified arthritis, unspecified site: Secondary | ICD-10-CM | POA: Diagnosis not present

## 2022-08-21 DIAGNOSIS — I951 Orthostatic hypotension: Secondary | ICD-10-CM | POA: Diagnosis not present

## 2022-08-21 DIAGNOSIS — I10 Essential (primary) hypertension: Secondary | ICD-10-CM | POA: Diagnosis not present

## 2022-08-21 DIAGNOSIS — R42 Dizziness and giddiness: Secondary | ICD-10-CM | POA: Diagnosis not present

## 2022-09-10 DIAGNOSIS — M545 Low back pain, unspecified: Secondary | ICD-10-CM | POA: Diagnosis not present

## 2022-09-16 DIAGNOSIS — M4805 Spinal stenosis, thoracolumbar region: Secondary | ICD-10-CM | POA: Diagnosis not present

## 2022-09-18 DIAGNOSIS — M4805 Spinal stenosis, thoracolumbar region: Secondary | ICD-10-CM | POA: Diagnosis not present

## 2022-09-21 DIAGNOSIS — M4805 Spinal stenosis, thoracolumbar region: Secondary | ICD-10-CM | POA: Diagnosis not present

## 2022-09-23 DIAGNOSIS — M4805 Spinal stenosis, thoracolumbar region: Secondary | ICD-10-CM | POA: Diagnosis not present

## 2022-09-24 DIAGNOSIS — I152 Hypertension secondary to endocrine disorders: Secondary | ICD-10-CM | POA: Diagnosis not present

## 2022-09-24 DIAGNOSIS — I10 Essential (primary) hypertension: Secondary | ICD-10-CM | POA: Diagnosis not present

## 2022-09-24 DIAGNOSIS — E1159 Type 2 diabetes mellitus with other circulatory complications: Secondary | ICD-10-CM | POA: Diagnosis not present

## 2022-09-24 DIAGNOSIS — Z299 Encounter for prophylactic measures, unspecified: Secondary | ICD-10-CM | POA: Diagnosis not present

## 2022-09-28 DIAGNOSIS — M4805 Spinal stenosis, thoracolumbar region: Secondary | ICD-10-CM | POA: Diagnosis not present

## 2022-09-30 DIAGNOSIS — M4805 Spinal stenosis, thoracolumbar region: Secondary | ICD-10-CM | POA: Diagnosis not present

## 2022-10-05 DIAGNOSIS — M4805 Spinal stenosis, thoracolumbar region: Secondary | ICD-10-CM | POA: Diagnosis not present

## 2022-10-08 DIAGNOSIS — M4805 Spinal stenosis, thoracolumbar region: Secondary | ICD-10-CM | POA: Diagnosis not present

## 2022-10-12 DIAGNOSIS — M4805 Spinal stenosis, thoracolumbar region: Secondary | ICD-10-CM | POA: Diagnosis not present

## 2022-10-14 DIAGNOSIS — M4805 Spinal stenosis, thoracolumbar region: Secondary | ICD-10-CM | POA: Diagnosis not present

## 2022-11-09 DIAGNOSIS — R5383 Other fatigue: Secondary | ICD-10-CM | POA: Diagnosis not present

## 2022-11-09 DIAGNOSIS — M1380 Other specified arthritis, unspecified site: Secondary | ICD-10-CM | POA: Diagnosis not present

## 2022-11-13 DIAGNOSIS — I10 Essential (primary) hypertension: Secondary | ICD-10-CM | POA: Diagnosis not present

## 2022-11-13 DIAGNOSIS — G44209 Tension-type headache, unspecified, not intractable: Secondary | ICD-10-CM | POA: Diagnosis not present

## 2022-11-13 DIAGNOSIS — M1380 Other specified arthritis, unspecified site: Secondary | ICD-10-CM | POA: Diagnosis not present

## 2022-11-13 DIAGNOSIS — D84821 Immunodeficiency due to drugs: Secondary | ICD-10-CM | POA: Diagnosis not present

## 2022-11-13 DIAGNOSIS — Z299 Encounter for prophylactic measures, unspecified: Secondary | ICD-10-CM | POA: Diagnosis not present

## 2022-12-10 DIAGNOSIS — Z125 Encounter for screening for malignant neoplasm of prostate: Secondary | ICD-10-CM | POA: Diagnosis not present

## 2022-12-10 DIAGNOSIS — Z1331 Encounter for screening for depression: Secondary | ICD-10-CM | POA: Diagnosis not present

## 2022-12-10 DIAGNOSIS — Z0000 Encounter for general adult medical examination without abnormal findings: Secondary | ICD-10-CM | POA: Diagnosis not present

## 2022-12-10 DIAGNOSIS — Z299 Encounter for prophylactic measures, unspecified: Secondary | ICD-10-CM | POA: Diagnosis not present

## 2022-12-10 DIAGNOSIS — I700 Atherosclerosis of aorta: Secondary | ICD-10-CM | POA: Diagnosis not present

## 2022-12-10 DIAGNOSIS — Z1339 Encounter for screening examination for other mental health and behavioral disorders: Secondary | ICD-10-CM | POA: Diagnosis not present

## 2022-12-10 DIAGNOSIS — I10 Essential (primary) hypertension: Secondary | ICD-10-CM | POA: Diagnosis not present

## 2022-12-10 DIAGNOSIS — I739 Peripheral vascular disease, unspecified: Secondary | ICD-10-CM | POA: Diagnosis not present

## 2022-12-10 DIAGNOSIS — E559 Vitamin D deficiency, unspecified: Secondary | ICD-10-CM | POA: Diagnosis not present

## 2022-12-10 DIAGNOSIS — F339 Major depressive disorder, recurrent, unspecified: Secondary | ICD-10-CM | POA: Diagnosis not present

## 2022-12-10 DIAGNOSIS — R5383 Other fatigue: Secondary | ICD-10-CM | POA: Diagnosis not present

## 2022-12-10 DIAGNOSIS — E7800 Pure hypercholesterolemia, unspecified: Secondary | ICD-10-CM | POA: Diagnosis not present

## 2022-12-10 DIAGNOSIS — Z7189 Other specified counseling: Secondary | ICD-10-CM | POA: Diagnosis not present

## 2022-12-10 DIAGNOSIS — Z79899 Other long term (current) drug therapy: Secondary | ICD-10-CM | POA: Diagnosis not present

## 2022-12-11 DIAGNOSIS — E7800 Pure hypercholesterolemia, unspecified: Secondary | ICD-10-CM | POA: Diagnosis not present

## 2022-12-11 DIAGNOSIS — E1165 Type 2 diabetes mellitus with hyperglycemia: Secondary | ICD-10-CM | POA: Diagnosis not present

## 2022-12-11 DIAGNOSIS — I10 Essential (primary) hypertension: Secondary | ICD-10-CM | POA: Diagnosis not present

## 2022-12-11 DIAGNOSIS — Z79899 Other long term (current) drug therapy: Secondary | ICD-10-CM | POA: Diagnosis not present

## 2022-12-11 DIAGNOSIS — M8588 Other specified disorders of bone density and structure, other site: Secondary | ICD-10-CM | POA: Diagnosis not present

## 2022-12-11 DIAGNOSIS — Z13820 Encounter for screening for osteoporosis: Secondary | ICD-10-CM | POA: Diagnosis not present

## 2023-01-21 DIAGNOSIS — Z299 Encounter for prophylactic measures, unspecified: Secondary | ICD-10-CM | POA: Diagnosis not present

## 2023-01-21 DIAGNOSIS — G4700 Insomnia, unspecified: Secondary | ICD-10-CM | POA: Diagnosis not present

## 2023-01-21 DIAGNOSIS — I700 Atherosclerosis of aorta: Secondary | ICD-10-CM | POA: Diagnosis not present

## 2023-01-21 DIAGNOSIS — F339 Major depressive disorder, recurrent, unspecified: Secondary | ICD-10-CM | POA: Diagnosis not present

## 2023-01-21 DIAGNOSIS — Z0000 Encounter for general adult medical examination without abnormal findings: Secondary | ICD-10-CM | POA: Diagnosis not present

## 2023-01-21 DIAGNOSIS — I10 Essential (primary) hypertension: Secondary | ICD-10-CM | POA: Diagnosis not present

## 2023-02-08 DIAGNOSIS — M1380 Other specified arthritis, unspecified site: Secondary | ICD-10-CM | POA: Diagnosis not present

## 2023-02-08 DIAGNOSIS — Z79899 Other long term (current) drug therapy: Secondary | ICD-10-CM | POA: Diagnosis not present

## 2023-02-08 DIAGNOSIS — R5383 Other fatigue: Secondary | ICD-10-CM | POA: Diagnosis not present

## 2023-02-18 DIAGNOSIS — R197 Diarrhea, unspecified: Secondary | ICD-10-CM | POA: Diagnosis not present

## 2023-02-18 DIAGNOSIS — E1165 Type 2 diabetes mellitus with hyperglycemia: Secondary | ICD-10-CM | POA: Diagnosis not present

## 2023-03-08 DIAGNOSIS — T466X5A Adverse effect of antihyperlipidemic and antiarteriosclerotic drugs, initial encounter: Secondary | ICD-10-CM | POA: Diagnosis not present

## 2023-03-08 DIAGNOSIS — R059 Cough, unspecified: Secondary | ICD-10-CM | POA: Diagnosis not present

## 2023-03-08 DIAGNOSIS — U071 COVID-19: Secondary | ICD-10-CM | POA: Diagnosis not present

## 2023-03-08 DIAGNOSIS — G720 Drug-induced myopathy: Secondary | ICD-10-CM | POA: Diagnosis not present

## 2023-03-08 DIAGNOSIS — Z299 Encounter for prophylactic measures, unspecified: Secondary | ICD-10-CM | POA: Diagnosis not present

## 2023-03-16 DIAGNOSIS — U071 COVID-19: Secondary | ICD-10-CM | POA: Diagnosis not present

## 2023-03-16 DIAGNOSIS — J069 Acute upper respiratory infection, unspecified: Secondary | ICD-10-CM | POA: Diagnosis not present

## 2023-03-16 DIAGNOSIS — Z299 Encounter for prophylactic measures, unspecified: Secondary | ICD-10-CM | POA: Diagnosis not present

## 2023-04-02 DIAGNOSIS — D171 Benign lipomatous neoplasm of skin and subcutaneous tissue of trunk: Secondary | ICD-10-CM | POA: Diagnosis not present

## 2023-04-02 DIAGNOSIS — E1159 Type 2 diabetes mellitus with other circulatory complications: Secondary | ICD-10-CM | POA: Diagnosis not present

## 2023-04-02 DIAGNOSIS — Z299 Encounter for prophylactic measures, unspecified: Secondary | ICD-10-CM | POA: Diagnosis not present

## 2023-04-02 DIAGNOSIS — I10 Essential (primary) hypertension: Secondary | ICD-10-CM | POA: Diagnosis not present

## 2023-04-02 DIAGNOSIS — L02419 Cutaneous abscess of limb, unspecified: Secondary | ICD-10-CM | POA: Diagnosis not present

## 2023-04-02 DIAGNOSIS — I152 Hypertension secondary to endocrine disorders: Secondary | ICD-10-CM | POA: Diagnosis not present

## 2023-04-19 DIAGNOSIS — Z79899 Other long term (current) drug therapy: Secondary | ICD-10-CM | POA: Diagnosis not present

## 2023-04-19 DIAGNOSIS — M1380 Other specified arthritis, unspecified site: Secondary | ICD-10-CM | POA: Diagnosis not present

## 2023-04-19 DIAGNOSIS — R5383 Other fatigue: Secondary | ICD-10-CM | POA: Diagnosis not present

## 2023-04-22 DIAGNOSIS — Z299 Encounter for prophylactic measures, unspecified: Secondary | ICD-10-CM | POA: Diagnosis not present

## 2023-04-22 DIAGNOSIS — I10 Essential (primary) hypertension: Secondary | ICD-10-CM | POA: Diagnosis not present

## 2023-04-22 DIAGNOSIS — M25511 Pain in right shoulder: Secondary | ICD-10-CM | POA: Diagnosis not present

## 2023-04-22 DIAGNOSIS — M2550 Pain in unspecified joint: Secondary | ICD-10-CM | POA: Diagnosis not present

## 2023-04-22 DIAGNOSIS — D84821 Immunodeficiency due to drugs: Secondary | ICD-10-CM | POA: Diagnosis not present

## 2023-04-22 DIAGNOSIS — M1380 Other specified arthritis, unspecified site: Secondary | ICD-10-CM | POA: Diagnosis not present

## 2023-06-03 ENCOUNTER — Telehealth: Payer: Self-pay | Admitting: *Deleted

## 2023-06-03 ENCOUNTER — Telehealth: Payer: Self-pay

## 2023-06-03 DIAGNOSIS — I10 Essential (primary) hypertension: Secondary | ICD-10-CM

## 2023-06-03 NOTE — Progress Notes (Signed)
  Care Coordination   Note   06/03/2023 Name: [name] MRN: [id number] DOB: 1946-02-23  Timothy Boyer is a 77 y.o. year old male who sees Vyas, Dhruv B, MD for primary care. I reached out to Rochele Raring by phone today to offer care coordination services.  [name] was given information about Care Coordination services today including:   The Care Coordination services include support from the care team which includes your Nurse Coordinator, Clinical Social Worker, or Pharmacist.  The Care Coordination team is here to help remove barriers to the health concerns and goals most important to you. Care Coordination services are voluntary, and the patient may decline or stop services at any time by request to their care team member.   Care Coordination Consent Status: Patient agreed to services and verbal consent obtained.   Follow up plan:  Telephone appointment with care coordination team member scheduled for:  12/2  Encounter Outcome:  Patient Scheduled  [facility] Coordination Care Guide  Direct Dial: [phone]

## 2023-06-08 DIAGNOSIS — H5203 Hypermetropia, bilateral: Secondary | ICD-10-CM | POA: Diagnosis not present

## 2023-06-08 DIAGNOSIS — E119 Type 2 diabetes mellitus without complications: Secondary | ICD-10-CM | POA: Diagnosis not present

## 2023-06-08 DIAGNOSIS — H52223 Regular astigmatism, bilateral: Secondary | ICD-10-CM | POA: Diagnosis not present

## 2023-06-08 DIAGNOSIS — H524 Presbyopia: Secondary | ICD-10-CM | POA: Diagnosis not present

## 2023-06-08 DIAGNOSIS — Z961 Presence of intraocular lens: Secondary | ICD-10-CM | POA: Diagnosis not present

## 2023-06-08 DIAGNOSIS — H25812 Combined forms of age-related cataract, left eye: Secondary | ICD-10-CM | POA: Diagnosis not present

## 2023-06-14 ENCOUNTER — Ambulatory Visit: Payer: Self-pay

## 2023-06-14 NOTE — Patient Outreach (Signed)
  Care Coordination   Initial Visit Note   06/14/2023 Name: Timothy Boyer MRN: [id number] DOB: Sep 23, 1945  Timothy Boyer is a 77 y.o. year old male who sees Vyas, Dhruv B, MD for primary care. I spoke with  Timothy Boyer's wife Timothy Boyer by phone today.  What matters to the patients health and wellness today?  Patient would like information on food options in the community.    Goals Addressed             This Visit's Progress    Care Coordination Activities       Interventions Today    Flowsheet Row Most Recent Value  General Interventions   General Interventions Discussed/Reviewed General Interventions Discussed, Communication with, General Interventions Reviewed  [Pt struggles with food.  Household income [phone] and would not qualify for Foodstamps. Sw agreed to email wife a list of food banks.]  Communication with --  [SW t/c [employer] and after a long hold, staff informs pt wife that the only benefit avialable is $50 OTC.]              SDOH assessments and interventions completed:  Yes  SDOH Interventions Today    Flowsheet Row Most Recent Value  SDOH Interventions   Food Insecurity Interventions Intervention Not Indicated, Other (Comment)  [Ref to food banks]  Housing Interventions Intervention Not Indicated  Transportation Interventions Intervention Not Indicated  Utilities Interventions Intervention Not Indicated        Care Coordination Interventions:  Yes, provided   Follow up plan: No further intervention required.   Encounter Outcome:  Patient Visit Completed

## 2023-06-14 NOTE — Patient Instructions (Signed)
Visit Information  Thank you for taking time to visit with me today. Please don't hesitate to contact me if I can be of assistance to you.   Following are the goals we discussed today:  Patient will follow up on food bank list. Patient will use the $50 OTC for supplies.    If you are experiencing a Mental Health or Behavioral Health Crisis or need someone to talk to, please call 911  Patient verbalizes understanding of instructions and care plan provided today and agrees to view in MyChart. Active MyChart status and patient understanding of how to access instructions and care plan via MyChart confirmed with patient.     No further follow up required: Patient does not request a follow up visit.    Lysle Morales, BSW Social Worker [phone]

## 2023-07-19 DIAGNOSIS — M1380 Other specified arthritis, unspecified site: Secondary | ICD-10-CM | POA: Diagnosis not present

## 2023-07-19 DIAGNOSIS — R5383 Other fatigue: Secondary | ICD-10-CM | POA: Diagnosis not present

## 2023-07-20 DIAGNOSIS — D692 Other nonthrombocytopenic purpura: Secondary | ICD-10-CM | POA: Diagnosis not present

## 2023-07-20 DIAGNOSIS — Z299 Encounter for prophylactic measures, unspecified: Secondary | ICD-10-CM | POA: Diagnosis not present

## 2023-07-20 DIAGNOSIS — I700 Atherosclerosis of aorta: Secondary | ICD-10-CM | POA: Diagnosis not present

## 2023-07-20 DIAGNOSIS — I739 Peripheral vascular disease, unspecified: Secondary | ICD-10-CM | POA: Diagnosis not present

## 2023-07-20 DIAGNOSIS — E1159 Type 2 diabetes mellitus with other circulatory complications: Secondary | ICD-10-CM | POA: Diagnosis not present

## 2023-07-20 DIAGNOSIS — I152 Hypertension secondary to endocrine disorders: Secondary | ICD-10-CM | POA: Diagnosis not present

## 2023-07-20 DIAGNOSIS — I10 Essential (primary) hypertension: Secondary | ICD-10-CM | POA: Diagnosis not present

## 2023-07-21 DIAGNOSIS — D84821 Immunodeficiency due to drugs: Secondary | ICD-10-CM | POA: Diagnosis not present

## 2023-07-21 DIAGNOSIS — M1380 Other specified arthritis, unspecified site: Secondary | ICD-10-CM | POA: Diagnosis not present

## 2023-07-21 DIAGNOSIS — M25532 Pain in left wrist: Secondary | ICD-10-CM | POA: Diagnosis not present

## 2023-07-21 DIAGNOSIS — I10 Essential (primary) hypertension: Secondary | ICD-10-CM | POA: Diagnosis not present

## 2023-07-21 DIAGNOSIS — Z299 Encounter for prophylactic measures, unspecified: Secondary | ICD-10-CM | POA: Diagnosis not present

## 2023-07-29 DIAGNOSIS — Z299 Encounter for prophylactic measures, unspecified: Secondary | ICD-10-CM | POA: Diagnosis not present

## 2023-07-29 DIAGNOSIS — R0781 Pleurodynia: Secondary | ICD-10-CM | POA: Diagnosis not present

## 2023-07-29 DIAGNOSIS — S2232XA Fracture of one rib, left side, initial encounter for closed fracture: Secondary | ICD-10-CM | POA: Diagnosis not present

## 2023-07-29 DIAGNOSIS — I10 Essential (primary) hypertension: Secondary | ICD-10-CM | POA: Diagnosis not present

## 2023-07-29 DIAGNOSIS — R918 Other nonspecific abnormal finding of lung field: Secondary | ICD-10-CM | POA: Diagnosis not present

## 2023-07-29 DIAGNOSIS — I700 Atherosclerosis of aorta: Secondary | ICD-10-CM | POA: Diagnosis not present

## 2023-07-29 DIAGNOSIS — D692 Other nonthrombocytopenic purpura: Secondary | ICD-10-CM | POA: Diagnosis not present

## 2023-09-21 DIAGNOSIS — I10 Essential (primary) hypertension: Secondary | ICD-10-CM | POA: Diagnosis not present

## 2023-09-21 DIAGNOSIS — W19XXXA Unspecified fall, initial encounter: Secondary | ICD-10-CM | POA: Diagnosis not present

## 2023-09-21 DIAGNOSIS — K529 Noninfective gastroenteritis and colitis, unspecified: Secondary | ICD-10-CM | POA: Diagnosis not present

## 2023-09-21 DIAGNOSIS — Z9181 History of falling: Secondary | ICD-10-CM | POA: Diagnosis not present

## 2023-09-21 DIAGNOSIS — M25552 Pain in left hip: Secondary | ICD-10-CM | POA: Diagnosis not present

## 2023-09-21 DIAGNOSIS — Z299 Encounter for prophylactic measures, unspecified: Secondary | ICD-10-CM | POA: Diagnosis not present

## 2023-09-21 DIAGNOSIS — M47816 Spondylosis without myelopathy or radiculopathy, lumbar region: Secondary | ICD-10-CM | POA: Diagnosis not present

## 2023-09-21 DIAGNOSIS — M1612 Unilateral primary osteoarthritis, left hip: Secondary | ICD-10-CM | POA: Diagnosis not present

## 2023-09-22 DIAGNOSIS — M7732 Calcaneal spur, left foot: Secondary | ICD-10-CM | POA: Diagnosis not present

## 2023-09-22 DIAGNOSIS — M19072 Primary osteoarthritis, left ankle and foot: Secondary | ICD-10-CM | POA: Diagnosis not present

## 2023-09-22 DIAGNOSIS — Z9181 History of falling: Secondary | ICD-10-CM | POA: Diagnosis not present

## 2023-09-22 DIAGNOSIS — M79672 Pain in left foot: Secondary | ICD-10-CM | POA: Diagnosis not present

## 2023-09-22 DIAGNOSIS — M7662 Achilles tendinitis, left leg: Secondary | ICD-10-CM | POA: Diagnosis not present

## 2023-10-11 DIAGNOSIS — R5383 Other fatigue: Secondary | ICD-10-CM | POA: Diagnosis not present

## 2023-10-11 DIAGNOSIS — M1380 Other specified arthritis, unspecified site: Secondary | ICD-10-CM | POA: Diagnosis not present

## 2023-10-11 DIAGNOSIS — Z79899 Other long term (current) drug therapy: Secondary | ICD-10-CM | POA: Diagnosis not present

## 2023-10-12 DIAGNOSIS — R197 Diarrhea, unspecified: Secondary | ICD-10-CM | POA: Diagnosis not present

## 2023-10-12 DIAGNOSIS — I10 Essential (primary) hypertension: Secondary | ICD-10-CM | POA: Diagnosis not present

## 2023-10-12 DIAGNOSIS — R109 Unspecified abdominal pain: Secondary | ICD-10-CM | POA: Diagnosis not present

## 2023-10-12 DIAGNOSIS — E1165 Type 2 diabetes mellitus with hyperglycemia: Secondary | ICD-10-CM | POA: Diagnosis not present

## 2023-10-12 DIAGNOSIS — Z299 Encounter for prophylactic measures, unspecified: Secondary | ICD-10-CM | POA: Diagnosis not present

## 2023-10-12 DIAGNOSIS — E1159 Type 2 diabetes mellitus with other circulatory complications: Secondary | ICD-10-CM | POA: Diagnosis not present

## 2023-10-15 DIAGNOSIS — R1031 Right lower quadrant pain: Secondary | ICD-10-CM | POA: Diagnosis not present

## 2023-10-15 DIAGNOSIS — R634 Abnormal weight loss: Secondary | ICD-10-CM | POA: Diagnosis not present

## 2023-10-15 DIAGNOSIS — N281 Cyst of kidney, acquired: Secondary | ICD-10-CM | POA: Diagnosis not present

## 2023-10-15 DIAGNOSIS — K5730 Diverticulosis of large intestine without perforation or abscess without bleeding: Secondary | ICD-10-CM | POA: Diagnosis not present

## 2023-10-15 DIAGNOSIS — R1032 Left lower quadrant pain: Secondary | ICD-10-CM | POA: Diagnosis not present

## 2023-10-27 ENCOUNTER — Ambulatory Visit: Admitting: Gastroenterology

## 2023-10-27 ENCOUNTER — Encounter: Payer: Self-pay | Admitting: Gastroenterology

## 2023-10-27 VITALS — BP 92/52 | HR 73 | Ht 67.0 in | Wt 179.2 lb

## 2023-10-27 DIAGNOSIS — R110 Nausea: Secondary | ICD-10-CM

## 2023-10-27 DIAGNOSIS — R140 Abdominal distension (gaseous): Secondary | ICD-10-CM | POA: Diagnosis not present

## 2023-10-27 DIAGNOSIS — K529 Noninfective gastroenteritis and colitis, unspecified: Secondary | ICD-10-CM

## 2023-10-27 DIAGNOSIS — R634 Abnormal weight loss: Secondary | ICD-10-CM

## 2023-10-27 MED ORDER — DIPHENOXYLATE-ATROPINE 2.5-0.025 MG PO TABS: 1.0000 | ORAL_TABLET | Freq: Four times a day (QID) | ORAL | 0 refills | Status: DC | PRN

## 2023-10-27 MED ORDER — NA SULFATE-K SULFATE-MG SULF 17.5-3.13-1.6 GM/177ML PO SOLN: 1.0000 | ORAL | 0 refills | Status: DC

## 2023-10-27 NOTE — Addendum Note (Signed)
 Addended by: Kerby Pearson on: 10/27/2023 12:41 PM   Modules accepted: Orders

## 2023-10-27 NOTE — Patient Instructions (Addendum)
 _______________________________________________________  If your blood pressure at your visit was 140/90 or greater, please contact your primary care physician to follow up on this. _______________________________________________________  If you are age 77 or older, your body mass index should be between 23-30. Your Body mass index is 28.07 kg/m. If this is out of the aforementioned range listed, please consider follow up with your Primary Care Provider. ________________________________________________________  The [facility] providers would like to encourage you to use MYCHART to communicate with providers for non-urgent requests or questions.  Due to long hold times on the telephone, sending your provider a message by [facility] may be a faster and more efficient way to get a response.  Please allow 48 business hours for a response.  Please remember that this is for non-urgent requests.  _______________________________________________________  Elene Griffes have been scheduled for an endoscopy and colonoscopy. Please follow the written instructions given to you at your visit today.  If you use inhalers (even only as needed), please bring them with you on the day of your procedure.  DO NOT TAKE 7 DAYS PRIOR TO TEST- Trulicity (dulaglutide) Ozempic, Wegovy (semaglutide) Mounjaro (tirzepatide) Bydureon Bcise (exanatide extended release)  DO NOT TAKE 1 DAY PRIOR TO YOUR TEST Rybelsus (semaglutide) Adlyxin (lixisenatide) Victoza (liraglutide) Byetta (exanatide) ___________________________________________________________________________  Due to recent changes in healthcare laws, you may see the results of your imaging and laboratory studies on MyChart before your provider has had a chance to review them.  We understand that in some cases there may be results that are confusing or concerning to you. Not all laboratory results come back in the same time frame and the provider may be waiting for multiple  results in order to interpret others.  Please give us  48 hours in order for your provider to thoroughly review all the results before contacting the office for clarification of your results.   It was a pleasure to see you today!  Thank you for trusting me with your gastrointestinal care!

## 2023-10-27 NOTE — Progress Notes (Signed)
 Timothy Boyer:  History: STEPHANOS Boyer 10/27/2023  Referring provider: Ignatius Specking, MD  Reason for consult/chief complaint: Abdominal Pain (Generalized Onset 2 month, RUQ burns occasionally, not taking omeprazole), Weight Loss (2 months), Diarrhea (Using Lomotil ), Gastroesophageal Reflux, Bloated, Nausea (occ), Hemorrhoids (Diarrhea is irritating his anal area), Colonoscopy (2020), and Weakness (2 months)  Summary of GI history  EGD and colonoscopy by Dr. Myrtie Boyer October 2020 for rectal bleeding and iron deficiency anemia.  Internal hemorrhoids found, diminutive tubular adenoma.  Recall not commended based on age and current guidelines.  No source of blood loss found on those exams. See reports-no primary care labs available but patient reported possible "kidney condition", raising question of whether CKD could be present and contributing to IDA.  No visits to this practice since then.  Subjective  HPI:  Timothy Boyer was companied by his wife today and referred by primary care for diarrhea and weight loss. He was doing well until about 2 months ago, when he had the rather abrupt onset of frequent loose watery stools including nocturnal bowel movements as well as some urgency and some accidents during the daytime.  He might have 4-6 BMs during the day and some more overnight, no blood seen.  No new or change in meds in the months prior to this onset.  Last med change was addition of methotrexate and folic acid about a year ago for his rheumatoid arthritis.  No travel sick contacts or antibiotic use preceding onset of the symptoms. He is also having some generalized abdominal burning at times and feels that this diarrhea has affected his appetite.  He has intermittent nausea without vomiting, and has dropped over 30 pounds in the last 2 months.  He was taking Imodium that did help to relieve the diarrhea until his primary care physician recommended he no longer do so  out of concerns for his opioid component.  He feels that his diarrhea is taking a lot out of him and he is fatigued and winded just walking to the mailbox.  ROS:  Review of Systems  Constitutional:  Positive for fatigue. Negative for appetite change and unexpected weight change.  HENT:  Negative for mouth sores and voice change.   Eyes:  Negative for pain and redness.  Respiratory:  Negative for cough and shortness of breath.   Cardiovascular:  Negative for chest pain and palpitations.  Genitourinary:  Negative for dysuria and hematuria.  Musculoskeletal:  Positive for arthralgias. Negative for myalgias.  Skin:  Negative for pallor and rash.  Neurological:  Negative for weakness and headaches.  Hematological:  Negative for adenopathy.     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   Colon cancer Paternal Uncle    Prostate cancer Paternal Uncle    Esophageal cancer Neg Hx    Stomach cancer Neg Hx    Diabetes Neg Hx    Rectal cancer Neg Hx     Social History: Social History   Socioeconomic History   Marital status: Married    Spouse name: Not on file   Number of children: 1   Years of education: Not on file   Highest education level: Not on file  Occupational History    Comment: Mechanic  Tobacco Use   Smoking status: Never    Smokeless tobacco: Never  Vaping Use   Vaping status: Never Used  Substance and Sexual Activity   Alcohol use: Never   Drug use: No   Sexual activity: Not on file  Other Topics Concern   Not on file  Social History Narrative   Not on file   Social Drivers of Health   Financial Resource Strain: Not on file  Food Insecurity: No Food Insecurity (06/14/2023)   Hunger Vital Sign    Worried About Running Out of Food in the Last Year: Never true    Ran Out of Food in the Last Year: Never true  Transportation Needs: No Transportation Needs (06/14/2023)   PRAPARE - Administrator, Civil Service (Medical): No    Lack of Transportation (Non-Medical): No  Physical Activity: Not on file  Stress: Not on file  Social Connections: Not on file    Allergies: Allergies  Allergen Reactions   Augmentin [Amoxicillin-Pot Clavulanate]    Lipitor [Atorvastatin]     tremor   Lopid [Gemfibrozil]    Penicillins    Singulair [Montelukast Sodium]     Outpatient Meds: Current Outpatient Medications  Medication Sig Dispense Refill   albuterol (PROAIR HFA) 108 (90 BASE) MCG/ACT inhaler Inhale 1-2 puffs into the lungs every 4 (four) hours as needed for wheezing or shortness of breath. 3 Inhaler 99   ASPIRIN 81 PO Take 1 tablet by mouth daily.     citalopram (CELEXA) 40 MG tablet Take 40 mg by mouth daily.     diphenoxylate-atropine (LOMOTIL) 2.5-0.025 MG tablet Take 1 tablet by mouth every 8 (eight) hours as needed.     ezetimibe (ZETIA) 10 MG tablet TAKE 1 TABLET EVERY DAY FOR CHOLESTEROL 90 tablet 3   finasteride (PROSCAR) 5 MG tablet Take 1 tablet (5 mg total) by mouth daily. 90 tablet 0   folic acid (FOLVITE) 1 MG tablet Take 2 mg by mouth daily.     furosemide (LASIX) 40 MG tablet Take 1 tablet (40 mg total) by mouth daily. For BP & Fluid 90 tablet 4   metFORMIN (GLUCOPHAGE) 1000 MG tablet Take 1 tablet (1,000 mg total) by mouth 2 (two) times daily with a meal. 180 tablet 3    methotrexate (RHEUMATREX) 2.5 MG tablet Take 24.5 mg by mouth once a week. 9 tablets once a week     metoprolol tartrate (LOPRESSOR) 100 MG tablet Take 100 mg by mouth daily.     minoxidil (LONITEN) 10 MG tablet TAKE 1 TABLET EVERY DAY 90 tablet 3   Na Sulfate-K Sulfate-Mg Sulfate concentrate (SUPREP BOWEL PREP KIT) 17.5-3.13-1.6 GM/177ML SOLN Take 1 kit (354 mLs total) by mouth as directed. 324 mL 0   potassium chloride SA (K-DUR,KLOR-CON) 20 MEQ tablet Take 1 tablet (20  mEq total) by mouth 2 (two) times daily. 180 tablet 3   VITAMIN D, CHOLECALCIFEROL, PO Take 1 tablet by mouth daily.     baclofen (LIORESAL) 10 MG tablet Take 10 mg by mouth 3 (three) times daily. (Patient not taking: Reported on 10/27/2023)     benazepril-hydrochlorthiazide (LOTENSIN HCT) 20-12.5 MG per tablet Take 2 tablets by mouth daily. Take 2 tablets daily for BP (Patient taking differently: Take 1 tablet by mouth 2 (two) times daily. Take 2 tablets daily for BP) 180 tablet 3   Ferrous Sulfate (IRON SUPPLEMENT PO) Take 65 mg by mouth 2 (two) times daily. (Patient not taking: Reported on 10/27/2023)     Fluticasone-Salmeterol (ADVAIR) 100-50 MCG/DOSE AEPB Inhale 1 puff into the lungs as needed.  (Patient not taking: Reported on 10/27/2023)     No current facility-administered medications for this visit.      ___________________________________________________________________ Objective   Exam:  BP (!) 92/52   Pulse 73   Ht 5\' 7"  (1.702 m)   Wt 179 lb 3.2 oz (81.3 kg)   SpO2 95%   BMI 28.07 kg/m  Wt Readings from Last 3 Encounters:  10/27/23 179 lb 3.2 oz (81.3 kg)  03/29/19 210 lb (95.3 kg)  07/09/15 217 lb 3.2 oz (98.5 kg)  Wife present for entire visit  General: No muscle wasting, he is not acutely ill-appearing.  Pleasant, conversational, gets on exam table independently Eyes: sclera anicteric, no redness ENT: oral mucosa moist without lesions, no cervical or supraclavicular lymphadenopathy CV: Regular  without appreciable murmur, no JVD, no peripheral edema Resp: clear to auscultation bilaterally, normal RR and effort noted GI: soft, quarter sized area of focal tenderness just under right costal margin, no lump or mass felt, no skin abnormality seen there.  This coincides with an area that he sometimes feels has a lump and separate burning sensation.   With active bowel sounds. No guarding or palpable organomegaly noted. Skin; warm and dry, no rash or jaundice noted Neuro: awake, alert and oriented x 3. Normal gross motor function and fluent speech Normal DRE, rectal exam normal, heme-negative  Labs:  Labs from primary care visit 10/12/2023 (At which time also his blood pressure medicines were decreased because of hypotension he was noted to have had a recent 18 pound weight loss)  BMP normal Calcium 9.8, albumin 4.1, LFTs normal WBC 6.2, hemoglobin 12.7, hematocrit 38, MCV 95, platelet 244, normal differential There are no reports of any stool studies having been performed   No radiologic studies done  No stool studies done (patient and his wife confirm that)  Assessment: Encounter Diagnoses  Name Primary?   Chronic diarrhea Yes   Weight loss    Abdominal bloating    Nausea in adult     Worrisome clinical scenario of over 2 months diarrhea nausea bloating generalized burning abdominal pain and marked weight loss.  Broad differential, must rule out infection first particularly C. difficile but also Giardia (a well water), Crohn's/colitis and microscopic colitis, celiac and others.  Plan: GI path panel -[facility] GI pathogen panel swab obtained in the office today EGD/colon scheduled.  He was agreeable after discussion of procedure risks.  The benefits and risks of the planned procedure(s) were described in detail with the patient or (when appropriate) their health care proxy.  Risks were outlined as including, but not limited to, bleeding, infection, perforation, adverse  medication reaction leading to cardiac or pulmonary decompensation, pancreatitis (if ERCP).  The limitation of incomplete mucosal visualization  was also discussed.  No guarantees or warranties were given.  This is scheduled least 2 weeks from now for time to get the GI pathogen panel results.  Patient requested renewal of Lomotil prescription which I did today.  It works well for him and has low/ acceptable risk in my opinion.  Thank you for the courtesy of this consult.  Please call me with any questions or concerns.  Kerby Pearson III  CC: Referring provider noted above

## 2023-11-02 ENCOUNTER — Telehealth: Payer: Self-pay | Admitting: Gastroenterology

## 2023-11-02 MED ORDER — AZITHROMYCIN 500 MG PO TABS: 500.0000 mg | ORAL_TABLET | Freq: Every day | ORAL | 0 refills | Status: AC

## 2023-11-02 NOTE — Telephone Encounter (Signed)
 Spoke with the patient. Lomotil  has been helpful. He agrees to this plan of care.  We will talk again in a week.

## 2023-11-02 NOTE — Telephone Encounter (Signed)
 This patient was seen in clinic last week with 2 months of diarrhea and weight loss.  Scheduled for EGD and colonoscopy next month pending results of his stool study.  GI pathogen panel done at last week's office visit is positive for Campylobacter, which may explain his diarrhea.  Campylobacter is a bacterial infection that we will typically clear without antibiotic treatment and not cause diarrhea for this long in most people.  So it is possible that Campylobacter is detected on this test but not necessarily causing the diarrhea.  That said, he is on immune suppressive medicine with methotrexate for his rheumatoid arthritis, and given this result and the severity and chronicity of his symptoms, we need to treat him with antibiotics and see how he does.  If this antibiotic treatment works, then he should feel better within a week.  I will send a prescription to his pharmacy for azithromycin  500 mg tablets, 1 tablet daily for 3 days.  We need to hear from him early next week with how he is doing.  If he is much improved, then we will most likely cancel the procedures.  If not, then we will still do them out of concern that there might be something else (such as microscopic colitis or other) causing this issue. (I sent a prescription to [facility] rather than the mail-order pharmacy that is listed as his default so that he can get it sooner.  Please let him know that and change the prescription to another pharmacy if he needs.)  Memory Staggers MD

## 2023-11-05 ENCOUNTER — Encounter: Payer: Self-pay | Admitting: Gastroenterology

## 2023-11-09 DIAGNOSIS — F339 Major depressive disorder, recurrent, unspecified: Secondary | ICD-10-CM | POA: Diagnosis not present

## 2023-11-09 DIAGNOSIS — Z299 Encounter for prophylactic measures, unspecified: Secondary | ICD-10-CM | POA: Diagnosis not present

## 2023-11-09 DIAGNOSIS — I10 Essential (primary) hypertension: Secondary | ICD-10-CM | POA: Diagnosis not present

## 2023-11-09 DIAGNOSIS — E1165 Type 2 diabetes mellitus with hyperglycemia: Secondary | ICD-10-CM | POA: Diagnosis not present

## 2023-11-11 NOTE — Telephone Encounter (Signed)
 Thank you for the update.  Based on that, it would seem that the Campylobacter infection was not the cause (or at least not the entire cause) of his diarrhea and weight loss.  We will proceed with the EGD and colonoscopy as scheduled and he can continue to use Lomotil  as needed in the interim.  Memory Staggers MD

## 2023-11-11 NOTE — Telephone Encounter (Signed)
 Spouse reports some improvement. The patient tells her it is not diarrhea anymore, but she is not certain. He continues to go to the bathroom to move his bowels 5 to 6 times. Rare incontinence but it does happen. "He does not stop at every single bathroom now." Endo/colon is on 11/29/23 Please advise.

## 2023-11-11 NOTE — Telephone Encounter (Addendum)
 Patient called stating he was supposed to hear from someone this week and has not heard from anyone. Patient is requesting a call back to discuss update. Please advise.   If you can't reach him you can call his wife at [phone]

## 2023-11-12 NOTE — Telephone Encounter (Signed)
 Confirmed with patient and spouse.

## 2023-11-22 ENCOUNTER — Encounter (HOSPITAL_COMMUNITY): Payer: Self-pay

## 2023-11-22 ENCOUNTER — Encounter: Payer: Self-pay | Admitting: Gastroenterology

## 2023-11-29 ENCOUNTER — Encounter: Payer: Self-pay | Admitting: Gastroenterology

## 2023-11-29 ENCOUNTER — Ambulatory Visit: Admitting: Gastroenterology

## 2023-11-29 VITALS — BP 140/86 | HR 79 | Temp 98.8°F | Resp 16 | Ht 67.0 in | Wt 179.0 lb

## 2023-11-29 DIAGNOSIS — K449 Diaphragmatic hernia without obstruction or gangrene: Secondary | ICD-10-CM

## 2023-11-29 DIAGNOSIS — R110 Nausea: Secondary | ICD-10-CM

## 2023-11-29 DIAGNOSIS — I10 Essential (primary) hypertension: Secondary | ICD-10-CM | POA: Diagnosis not present

## 2023-11-29 DIAGNOSIS — R140 Abdominal distension (gaseous): Secondary | ICD-10-CM | POA: Diagnosis not present

## 2023-11-29 DIAGNOSIS — K2289 Other specified disease of esophagus: Secondary | ICD-10-CM | POA: Diagnosis not present

## 2023-11-29 DIAGNOSIS — R634 Abnormal weight loss: Secondary | ICD-10-CM | POA: Diagnosis not present

## 2023-11-29 DIAGNOSIS — K6389 Other specified diseases of intestine: Secondary | ICD-10-CM | POA: Diagnosis not present

## 2023-11-29 DIAGNOSIS — E119 Type 2 diabetes mellitus without complications: Secondary | ICD-10-CM | POA: Diagnosis not present

## 2023-11-29 DIAGNOSIS — K5732 Diverticulitis of large intestine without perforation or abscess without bleeding: Secondary | ICD-10-CM

## 2023-11-29 DIAGNOSIS — K5730 Diverticulosis of large intestine without perforation or abscess without bleeding: Secondary | ICD-10-CM | POA: Diagnosis not present

## 2023-11-29 DIAGNOSIS — K529 Noninfective gastroenteritis and colitis, unspecified: Secondary | ICD-10-CM | POA: Diagnosis not present

## 2023-11-29 DIAGNOSIS — F419 Anxiety disorder, unspecified: Secondary | ICD-10-CM | POA: Diagnosis not present

## 2023-11-29 DIAGNOSIS — K648 Other hemorrhoids: Secondary | ICD-10-CM

## 2023-11-29 DIAGNOSIS — E669 Obesity, unspecified: Secondary | ICD-10-CM | POA: Diagnosis not present

## 2023-11-29 MED ORDER — SODIUM CHLORIDE 0.9 % IV SOLN: 500.0000 mL | INTRAVENOUS | Status: DC

## 2023-11-29 NOTE — Op Note (Signed)
 [facility] Patient Name: Timothy Boyer Procedure Date: 11/29/2023 2:09 PM MRN: [id number] Endoscopist: Ace Abu L. Dominic Friendly , MD, [id number] Age: 78 Referring MD:  Date of Birth: [birth year] Gender: Male Account #: [id number] Procedure:                Colonoscopy Indications:              Generalized abdominal pain, Chronic diarrhea (acute                            onset, 3 months ago), Weight loss                           clinical details in recent office consult note and                            today's H+P                           Stool study positive for Campylobacter, treated                            with azithromycin . This reduced, but did not                            resolve, the diarrhea, and he reports weight loss                            continues. Medicines:                Monitored Anesthesia Care Procedure:                Pre-Anesthesia Assessment:                           - Prior to the procedure, a History and Physical                            was performed, and patient medications and                            allergies were reviewed. The patient's tolerance of                            previous anesthesia was also reviewed. The risks                            and benefits of the procedure and the sedation                            options and risks were discussed with the patient.                            All questions were answered, and informed consent  was obtained. Prior Anticoagulants: The patient has                            taken no anticoagulant or antiplatelet agents. ASA                            Grade Assessment: II - A patient with mild systemic                            disease. After reviewing the risks and benefits,                            the patient was deemed in satisfactory condition to                            undergo the procedure.                           After obtaining informed  consent, the colonoscope                            was passed under direct vision. Throughout the                            procedure, the patient's blood pressure, pulse, and                            oxygen saturations were monitored continuously. The                            Olympus CF-HQ190L ([id number]) Colonoscope was                            introduced through the anus and advanced to the the                            terminal ileum, with identification of the                            appendiceal orifice and IC valve. The colonoscopy                            was performed without difficulty. The patient                            tolerated the procedure well. The quality of the                            bowel preparation was good. The ileocecal valve,                            appendiceal orifice, and rectum were photographed. Scope In: 2:17:40 PM Scope Out: 2:31:40 PM Scope Withdrawal Time: 0 hours 7 minutes 0 seconds  Total Procedure  Duration: 0 hours 14 minutes 0 seconds  Findings:                 The perianal and digital rectal examinations were                            normal.                           The terminal ileum appeared normal.                           Repeat examination of right colon under NBI                            performed.                           Normal mucosa was found in the entire colon.                            Biopsies for histology were taken with a cold                            forceps from the right colon and left colon for                            evaluation of microscopic colitis.                           Multiple diverticula were found in the left colon                            and right colon.                           Internal hemorrhoids were found. Complications:            No immediate complications. Estimated Blood Loss:     Estimated blood loss was minimal. Impression:               - The examined portion of the  ileum was normal.                           - Normal mucosa in the entire examined colon.                            Biopsied.                           - Diverticulosis in the left colon and in the right                            colon.                           - Internal hemorrhoids. Recommendation:           -  Patient has a contact number available for                            emergencies. The signs and symptoms of potential                            delayed complications were discussed with the                            patient. Return to normal activities tomorrow.                            Written discharge instructions were provided to the                            patient.                           - Resume previous diet.                           - Continue present medications.                           - Await pathology results.                           - No repeat colonoscopy for CRC screening purposes                            recommended (age, no polyps on this exam).                           - See the other procedure note for documentation of                            additional recommendations. Jeannie Mallinger L. Dominic Friendly, MD 11/29/2023 2:48:34 PM This report has been signed electronically.

## 2023-11-29 NOTE — Progress Notes (Signed)
 Report given to PACU, vss

## 2023-11-29 NOTE — Op Note (Signed)
 [facility] Patient Name: Timothy Boyer Procedure Date: 11/29/2023 2:10 PM MRN: [id number] Endoscopist: Ace Abu L. Dominic Friendly , MD, [id number] Age: 78 Referring MD:  Date of Birth: 1945/07/21 Gender: Male Account #: [id number] Procedure:                Upper GI endoscopy Indications:              Generalized abdominal pain, Diarrhea, Weight loss Medicines:                Monitored Anesthesia Care Procedure:                Pre-Anesthesia Assessment:                           - Prior to the procedure, a History and Physical                            was performed, and patient medications and                            allergies were reviewed. The patient's tolerance of                            previous anesthesia was also reviewed. The risks                            and benefits of the procedure and the sedation                            options and risks were discussed with the patient.                            All questions were answered, and informed consent                            was obtained. Prior Anticoagulants: The patient has                            taken no anticoagulant or antiplatelet agents. ASA                            Grade Assessment: II - A patient with mild systemic                            disease. After reviewing the risks and benefits,                            the patient was deemed in satisfactory condition to                            undergo the procedure.                           - Prior to the procedure, a History and Physical  was performed, and patient medications and                            allergies were reviewed. The patient's tolerance of                            previous anesthesia was also reviewed. The risks                            and benefits of the procedure and the sedation                            options and risks were discussed with the patient.                            All questions  were answered, and informed consent                            was obtained. Prior Anticoagulants: The patient has                            taken no anticoagulant or antiplatelet agents. ASA                            Grade Assessment: II - A patient with mild systemic                            disease. After reviewing the risks and benefits,                            the patient was deemed in satisfactory condition to                            undergo the procedure.                           After obtaining informed consent, the endoscope was                            passed under direct vision. Throughout the                            procedure, the patient's blood pressure, pulse, and                            oxygen saturations were monitored continuously. The                            Olympus Scope [id number] was introduced through the                            mouth, and advanced to the second part of duodenum.  The upper GI endoscopy was accomplished without                            difficulty. The patient tolerated the procedure                            well. Scope In: Scope Out: Findings:                 The larynx was normal.                           A small (1-2 cm) hiatal hernia was present.                           The stomach was normal.                           The cardia and gastric fundus were normal on                            retroflexion.                           Normal mucosa was found in the entire duodenum.                            Biopsies for histology were taken with a cold                            forceps for evaluation of celiac disease.                           The Z-line was variable. (< 1cm) Complications:            No immediate complications. Estimated Blood Loss:     Estimated blood loss was minimal. Impression:               - Normal larynx.                           - Small hiatal hernia.                            - Normal stomach.                           - Normal mucosa was found in the entire examined                            duodenum. Biopsied. Recommendation:           - Patient has a contact number available for                            emergencies. The signs and symptoms of potential  delayed complications were discussed with the                            patient. Return to normal activities tomorrow.                            Written discharge instructions were provided to the                            patient.                           - Resume previous diet.                           - Continue present medications.                           - Await pathology results.                           - See the other procedure note for documentation of                            additional recommendations. Gurpreet Mikhail L. Dominic Friendly, MD 11/29/2023 2:51:49 PM This report has been signed electronically.

## 2023-11-29 NOTE — Progress Notes (Signed)
 Pt's states no medical or surgical changes since previsit or office visit.

## 2023-11-29 NOTE — Patient Instructions (Signed)
 Resume previous diet and medications Await pathology results No repeat colonoscopy for screening purposes recommended   YOU HAD AN ENDOSCOPIC PROCEDURE TODAY AT THE [facility]:   Refer to the procedure report that was given to you for any specific questions about what was found during the examination.  If the procedure report does not answer your questions, please call your gastroenterologist to clarify.  If you requested that your care partner not be given the details of your procedure findings, then the procedure report has been included in a sealed envelope for you to review at your convenience later.  YOU SHOULD EXPECT: Some feelings of bloating in the abdomen. Passage of more gas than usual.  Walking can help get rid of the air that was put into your GI tract during the procedure and reduce the bloating. If you had a lower endoscopy (such as a colonoscopy or flexible sigmoidoscopy) you may notice spotting of blood in your stool or on the toilet paper. If you underwent a bowel prep for your procedure, you may not have a normal bowel movement for a few days.  Please Note:  You might notice some irritation and congestion in your nose or some drainage.  This is from the oxygen used during your procedure.  There is no need for concern and it should clear up in a day or so.  SYMPTOMS TO REPORT IMMEDIATELY:  Following lower endoscopy (colonoscopy or flexible sigmoidoscopy):  Excessive amounts of blood in the stool  Significant tenderness or worsening of abdominal pains  Swelling of the abdomen that is new, acute  Fever of 100F or higher  Following upper endoscopy (EGD)  Vomiting of blood or coffee ground material  New chest pain or pain under the shoulder blades  Painful or persistently difficult swallowing  New shortness of breath  Fever of 100F or higher  Black, tarry-looking stools  For urgent or emergent issues, a gastroenterologist can be reached at any hour by calling  [phone]. Do not use MyChart messaging for urgent concerns.    DIET:  We do recommend a small meal at first, but then you may proceed to your regular diet.  Drink plenty of fluids but you should avoid alcoholic beverages for 24 hours.  ACTIVITY:  You should plan to take it easy for the rest of today and you should NOT DRIVE or use heavy machinery until tomorrow (because of the sedation medicines used during the test).    FOLLOW UP: Our staff will call the number listed on your records the next business day following your procedure.  We will call around 7:15- 8:00 am to check on you and address any questions or concerns that you may have regarding the information given to you following your procedure. If we do not reach you, we will leave a message.     If any biopsies were taken you will be contacted by phone or by letter within the next 1-3 weeks.  Please call us  at [phone] if you have not heard about the biopsies in 3 weeks.    SIGNATURES/CONFIDENTIALITY: You and/or your care partner have signed paperwork which will be entered into your electronic medical record.  These signatures attest to the fact that that the information above on your After Visit Summary has been reviewed and is understood.  Full responsibility of the confidentiality of this discharge information lies with you and/or your care-partner.

## 2023-11-29 NOTE — Progress Notes (Signed)
 History and Physical:  This patient presents for endoscopic testing for: Encounter Diagnoses  Name Primary?   Chronic diarrhea Yes   Weight loss    Abdominal bloating    Nausea in adult     78 year old man here today for endoscopic evaluation of multiple digestive symptoms including diarrhea, weight loss and nausea.  Stool studies were positive for Campylobacter that was of uncertain significance regarding his symptoms.  Because he is on immune suppressive therapy and due to the severity of symptoms, he was treated with azithromycin . Patient reports today that while his diarrhea did improve somewhat with that treatment, he still has stools 2-3 times a day that are loose sometimes.  He also continues to have inadvertent weight loss.  Today he is also describing an intermittent burning pain to the right of the umbilicus.  Patient is otherwise without complaints or active issues today.   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)    Obesity    Vitamin D  deficiency      Past Surgical History: Past Surgical History:  Procedure Laterality Date   BACK SURGERY     Carpel Tunnal     CATARACT EXTRACTION PHACO AND INTRAOCULAR LENS PLACEMENT W/ CORTICOSTEROID Right    ELBOW SURGERY      Allergies: Allergies  Allergen Reactions   Lipitor [Atorvastatin] Other (See Comments)    tremor   Lopid [Gemfibrozil] Other (See Comments)   Singulair [Montelukast Sodium] Other (See Comments)    Unsure what it does    Augmentin [Amoxicillin-Pot Clavulanate] Rash   Penicillins Rash    Outpatient Meds: Current Outpatient Medications  Medication Sig Dispense Refill   ASPIRIN  81 PO Take 1 tablet by mouth daily.     benazepril -hydrochlorthiazide (LOTENSIN  HCT)  20-12.5 MG per tablet Take 2 tablets by mouth daily. Take 2 tablets daily for BP (Patient taking differently: Take 1 tablet by mouth 2 (two) times daily. Take 2 tablets daily for BP) 180 tablet 3   citalopram  (CELEXA ) 40 MG tablet Take 40 mg by mouth daily.     diphenoxylate -atropine  (LOMOTIL ) 2.5-0.025 MG tablet Take 1 tablet by mouth 4 (four) times daily as needed for diarrhea or loose stools. 60 tablet 0   ezetimibe  (ZETIA ) 10 MG tablet TAKE 1 TABLET EVERY DAY FOR CHOLESTEROL 90 tablet 3   finasteride  (PROSCAR ) 5 MG tablet Take 1 tablet (5 mg total) by mouth daily. 90 tablet 0   folic acid (FOLVITE) 1 MG tablet Take 2 mg by mouth daily.     furosemide  (LASIX ) 40 MG tablet Take 1 tablet (40 mg total) by mouth daily. For BP & Fluid 90 tablet 4   metFORMIN  (GLUCOPHAGE ) 1000 MG tablet Take 1 tablet (1,000 mg total) by mouth 2 (two) times daily with a meal. 180 tablet 3   methotrexate (RHEUMATREX) 2.5 MG tablet Take 24.5 mg by mouth once a week. 9 tablets once a week     metoprolol tartrate (LOPRESSOR) 100 MG tablet Take 100 mg by mouth daily.     minoxidil  (LONITEN ) 10 MG tablet TAKE 1 TABLET EVERY DAY 90 tablet 3   potassium chloride  SA (K-DUR,KLOR-CON ) 20 MEQ tablet Take  1 tablet (20 mEq total) by mouth 2 (two) times daily. 180 tablet 3   VITAMIN D , CHOLECALCIFEROL, PO Take 1 tablet by mouth daily.     albuterol  (PROAIR  HFA) 108 (90 BASE) MCG/ACT inhaler Inhale 1-2 puffs into the lungs every 4 (four) hours as needed for wheezing or shortness of breath. 3 Inhaler 99   baclofen (LIORESAL) 10 MG tablet Take 10 mg by mouth 3 (three) times daily. (Patient not taking: Reported on 11/29/2023)     Ferrous Sulfate (IRON SUPPLEMENT PO) Take 65 mg by mouth 2 (two) times daily. (Patient not taking: Reported on 11/29/2023)     Fluticasone-Salmeterol (ADVAIR) 100-50 MCG/DOSE AEPB Inhale 1 puff into the lungs as needed.  (Patient not taking: Reported on 11/29/2023)     Current Facility-Administered Medications   Medication Dose Route Frequency Provider Last Rate Last Admin   0.9 %  sodium chloride  infusion  500 mL Intravenous Continuous Kerby Pearson III, MD          ___________________________________________________________________ Objective   Exam:  BP 108/79   Pulse 78   Temp 98.8 F (37.1 C)   Ht 5\' 7"  (1.702 m)   Wt 179 lb (81.2 kg)   SpO2 98%   BMI 28.04 kg/m   CV: regular , S1/S2 Resp: clear to auscultation bilaterally, normal RR and effort noted GI: soft, no tenderness, with active bowel sounds.  No tenderness in the area he is describing noted above.  No palpable abnormality of the abdominal wall or mass felt.   Assessment: Encounter Diagnoses  Name Primary?   Chronic diarrhea Yes   Weight loss    Abdominal bloating    Nausea in adult      Plan: Colonoscopy EGD  The benefits and risks of the planned procedure(s) were described in detail with the patient or (when appropriate) their health care proxy.  Risks were outlined as including, but not limited to, bleeding, infection, perforation, adverse medication reaction leading to cardiac or pulmonary decompensation, pancreatitis (if ERCP).  The limitation of incomplete mucosal visualization was also discussed.  No guarantees or warranties were given.  The patient is appropriate for an endoscopic procedure in the ambulatory setting.   - Lorella Roles, MD

## 2023-11-29 NOTE — Progress Notes (Signed)
 Called to room to assist during endoscopic procedure.  Patient ID and intended procedure confirmed with present staff. Received instructions for my participation in the procedure from the performing physician.

## 2023-11-29 NOTE — Progress Notes (Signed)
1409 Robinul 0.1 mg IV given due large amount of secretions upon assessment.  MD made aware, vss  ?

## 2023-11-30 ENCOUNTER — Telehealth: Payer: Self-pay

## 2023-11-30 NOTE — Telephone Encounter (Signed)
 Attempted to reach patient for post-procedure f/u call. No answer. Left message for him to please not hesitate to call if he has any questions/concerns regarding his care.

## 2023-12-02 LAB — SURGICAL PATHOLOGY

## 2023-12-07 ENCOUNTER — Other Ambulatory Visit: Payer: Self-pay

## 2023-12-07 ENCOUNTER — Ambulatory Visit: Payer: Self-pay | Admitting: Gastroenterology

## 2023-12-07 DIAGNOSIS — R1084 Generalized abdominal pain: Secondary | ICD-10-CM

## 2023-12-07 DIAGNOSIS — R634 Abnormal weight loss: Secondary | ICD-10-CM

## 2023-12-07 DIAGNOSIS — K529 Noninfective gastroenteritis and colitis, unspecified: Secondary | ICD-10-CM

## 2024-01-11 DIAGNOSIS — R5383 Other fatigue: Secondary | ICD-10-CM | POA: Diagnosis not present

## 2024-01-11 DIAGNOSIS — Z299 Encounter for prophylactic measures, unspecified: Secondary | ICD-10-CM | POA: Diagnosis not present

## 2024-01-11 DIAGNOSIS — Z1339 Encounter for screening examination for other mental health and behavioral disorders: Secondary | ICD-10-CM | POA: Diagnosis not present

## 2024-01-11 DIAGNOSIS — Z0000 Encounter for general adult medical examination without abnormal findings: Secondary | ICD-10-CM | POA: Diagnosis not present

## 2024-01-11 DIAGNOSIS — R52 Pain, unspecified: Secondary | ICD-10-CM | POA: Diagnosis not present

## 2024-01-11 DIAGNOSIS — I10 Essential (primary) hypertension: Secondary | ICD-10-CM | POA: Diagnosis not present

## 2024-01-11 DIAGNOSIS — M8580 Other specified disorders of bone density and structure, unspecified site: Secondary | ICD-10-CM | POA: Diagnosis not present

## 2024-01-11 DIAGNOSIS — E7800 Pure hypercholesterolemia, unspecified: Secondary | ICD-10-CM | POA: Diagnosis not present

## 2024-01-11 DIAGNOSIS — Z1331 Encounter for screening for depression: Secondary | ICD-10-CM | POA: Diagnosis not present

## 2024-01-11 DIAGNOSIS — Z125 Encounter for screening for malignant neoplasm of prostate: Secondary | ICD-10-CM | POA: Diagnosis not present

## 2024-01-11 DIAGNOSIS — E538 Deficiency of other specified B group vitamins: Secondary | ICD-10-CM | POA: Diagnosis not present

## 2024-01-11 DIAGNOSIS — Z79899 Other long term (current) drug therapy: Secondary | ICD-10-CM | POA: Diagnosis not present

## 2024-01-11 DIAGNOSIS — Z7189 Other specified counseling: Secondary | ICD-10-CM | POA: Diagnosis not present

## 2024-02-02 DIAGNOSIS — E1165 Type 2 diabetes mellitus with hyperglycemia: Secondary | ICD-10-CM | POA: Diagnosis not present

## 2024-02-02 DIAGNOSIS — Z299 Encounter for prophylactic measures, unspecified: Secondary | ICD-10-CM | POA: Diagnosis not present

## 2024-02-02 DIAGNOSIS — Z6828 Body mass index (BMI) 28.0-28.9, adult: Secondary | ICD-10-CM | POA: Diagnosis not present

## 2024-02-02 DIAGNOSIS — I10 Essential (primary) hypertension: Secondary | ICD-10-CM | POA: Diagnosis not present

## 2024-02-02 DIAGNOSIS — R52 Pain, unspecified: Secondary | ICD-10-CM | POA: Diagnosis not present

## 2024-02-02 DIAGNOSIS — Z0000 Encounter for general adult medical examination without abnormal findings: Secondary | ICD-10-CM | POA: Diagnosis not present

## 2024-03-06 ENCOUNTER — Other Ambulatory Visit: Payer: Self-pay | Admitting: Gastroenterology

## 2024-03-29 DIAGNOSIS — M2540 Effusion, unspecified joint: Secondary | ICD-10-CM | POA: Diagnosis not present

## 2024-03-29 DIAGNOSIS — M79642 Pain in left hand: Secondary | ICD-10-CM | POA: Diagnosis not present

## 2024-03-29 DIAGNOSIS — Z6829 Body mass index (BMI) 29.0-29.9, adult: Secondary | ICD-10-CM | POA: Diagnosis not present

## 2024-03-29 DIAGNOSIS — M2560 Stiffness of unspecified joint, not elsewhere classified: Secondary | ICD-10-CM | POA: Diagnosis not present

## 2024-03-29 DIAGNOSIS — M79641 Pain in right hand: Secondary | ICD-10-CM | POA: Diagnosis not present

## 2024-03-29 DIAGNOSIS — E663 Overweight: Secondary | ICD-10-CM | POA: Diagnosis not present

## 2024-04-19 DIAGNOSIS — M0609 Rheumatoid arthritis without rheumatoid factor, multiple sites: Secondary | ICD-10-CM | POA: Diagnosis not present

## 2024-04-19 DIAGNOSIS — M79641 Pain in right hand: Secondary | ICD-10-CM | POA: Diagnosis not present

## 2024-04-19 DIAGNOSIS — M1120 Other chondrocalcinosis, unspecified site: Secondary | ICD-10-CM | POA: Diagnosis not present

## 2024-04-19 DIAGNOSIS — M2560 Stiffness of unspecified joint, not elsewhere classified: Secondary | ICD-10-CM | POA: Diagnosis not present

## 2024-04-19 DIAGNOSIS — M79642 Pain in left hand: Secondary | ICD-10-CM | POA: Diagnosis not present

## 2024-04-19 DIAGNOSIS — Z6829 Body mass index (BMI) 29.0-29.9, adult: Secondary | ICD-10-CM | POA: Diagnosis not present

## 2024-04-19 DIAGNOSIS — E663 Overweight: Secondary | ICD-10-CM | POA: Diagnosis not present

## 2024-04-19 DIAGNOSIS — M2540 Effusion, unspecified joint: Secondary | ICD-10-CM | POA: Diagnosis not present

## 2024-05-09 DIAGNOSIS — Z299 Encounter for prophylactic measures, unspecified: Secondary | ICD-10-CM | POA: Diagnosis not present

## 2024-05-09 DIAGNOSIS — I10 Essential (primary) hypertension: Secondary | ICD-10-CM | POA: Diagnosis not present

## 2024-05-09 DIAGNOSIS — E119 Type 2 diabetes mellitus without complications: Secondary | ICD-10-CM | POA: Diagnosis not present

## 2024-05-09 DIAGNOSIS — N3941 Urge incontinence: Secondary | ICD-10-CM | POA: Diagnosis not present
